# Patient Record
Sex: Female | Born: 1945 | Race: White | Hispanic: No | Marital: Married | Smoking: Never smoker
Health system: Southern US, Community
[De-identification: ages and names within clinical notes are randomized; demographics above are authoritative.]

## PROBLEM LIST (undated history)

## (undated) DIAGNOSIS — I10 Essential (primary) hypertension: Secondary | ICD-10-CM

## (undated) DIAGNOSIS — Z9889 Other specified postprocedural states: Secondary | ICD-10-CM

## (undated) DIAGNOSIS — K219 Gastro-esophageal reflux disease without esophagitis: Secondary | ICD-10-CM

## (undated) DIAGNOSIS — N8110 Cystocele, unspecified: Secondary | ICD-10-CM

## (undated) DIAGNOSIS — N390 Urinary tract infection, site not specified: Secondary | ICD-10-CM

## (undated) DIAGNOSIS — R112 Nausea with vomiting, unspecified: Secondary | ICD-10-CM

## (undated) DIAGNOSIS — M1990 Unspecified osteoarthritis, unspecified site: Secondary | ICD-10-CM

## (undated) DIAGNOSIS — H409 Unspecified glaucoma: Secondary | ICD-10-CM

## (undated) HISTORY — DX: Urinary tract infection, site not specified: N39.0

## (undated) HISTORY — PX: HAND SURGERY: SHX662

## (undated) HISTORY — DX: Cystocele, unspecified: N81.10

---

## 1987-12-31 HISTORY — PX: BREAST CYST ASPIRATION: SHX578

## 1997-01-13 DIAGNOSIS — N390 Urinary tract infection, site not specified: Secondary | ICD-10-CM | POA: Insufficient documentation

## 2005-03-27 ENCOUNTER — Ambulatory Visit: Payer: Self-pay | Admitting: Internal Medicine

## 2005-04-03 ENCOUNTER — Ambulatory Visit: Payer: Self-pay | Admitting: Internal Medicine

## 2006-04-16 ENCOUNTER — Ambulatory Visit: Payer: Self-pay | Admitting: Internal Medicine

## 2006-05-27 ENCOUNTER — Ambulatory Visit: Payer: Self-pay | Admitting: Unknown Physician Specialty

## 2006-05-30 DIAGNOSIS — N8110 Cystocele, unspecified: Secondary | ICD-10-CM | POA: Insufficient documentation

## 2007-04-22 ENCOUNTER — Ambulatory Visit: Payer: Self-pay | Admitting: Internal Medicine

## 2007-11-11 ENCOUNTER — Ambulatory Visit: Payer: Self-pay | Admitting: Internal Medicine

## 2008-05-19 ENCOUNTER — Ambulatory Visit: Payer: Self-pay | Admitting: Internal Medicine

## 2009-05-24 ENCOUNTER — Ambulatory Visit: Payer: Self-pay | Admitting: Internal Medicine

## 2010-06-18 ENCOUNTER — Ambulatory Visit: Payer: Self-pay | Admitting: Internal Medicine

## 2010-09-12 ENCOUNTER — Ambulatory Visit: Payer: Self-pay | Admitting: Unknown Physician Specialty

## 2011-06-20 ENCOUNTER — Ambulatory Visit: Payer: Self-pay | Admitting: Internal Medicine

## 2012-06-22 ENCOUNTER — Ambulatory Visit: Payer: Self-pay | Admitting: Internal Medicine

## 2013-01-12 ENCOUNTER — Ambulatory Visit: Payer: Self-pay | Admitting: Surgery

## 2013-01-12 LAB — CBC
HGB: 13.5 g/dL (ref 12.0–16.0)
MCV: 89 fL (ref 80–100)
RBC: 4.56 10*6/uL (ref 3.80–5.20)

## 2013-01-19 ENCOUNTER — Ambulatory Visit: Payer: Self-pay | Admitting: Surgery

## 2013-07-12 ENCOUNTER — Ambulatory Visit: Payer: Self-pay | Admitting: Internal Medicine

## 2014-02-23 ENCOUNTER — Ambulatory Visit: Payer: Self-pay | Admitting: Unknown Physician Specialty

## 2014-04-20 ENCOUNTER — Emergency Department: Payer: Self-pay | Admitting: Internal Medicine

## 2014-05-10 ENCOUNTER — Encounter: Payer: Self-pay | Admitting: Orthopedic Surgery

## 2014-05-29 DIAGNOSIS — M810 Age-related osteoporosis without current pathological fracture: Secondary | ICD-10-CM | POA: Insufficient documentation

## 2014-05-29 DIAGNOSIS — I10 Essential (primary) hypertension: Secondary | ICD-10-CM | POA: Insufficient documentation

## 2014-05-29 DIAGNOSIS — R87619 Unspecified abnormal cytological findings in specimens from cervix uteri: Secondary | ICD-10-CM | POA: Insufficient documentation

## 2014-05-29 DIAGNOSIS — K219 Gastro-esophageal reflux disease without esophagitis: Secondary | ICD-10-CM | POA: Insufficient documentation

## 2014-05-30 ENCOUNTER — Encounter: Payer: Self-pay | Admitting: Orthopedic Surgery

## 2014-06-29 ENCOUNTER — Encounter: Payer: Self-pay | Admitting: Orthopedic Surgery

## 2014-07-13 ENCOUNTER — Ambulatory Visit: Payer: Self-pay | Admitting: Internal Medicine

## 2014-07-30 ENCOUNTER — Encounter: Payer: Self-pay | Admitting: Orthopedic Surgery

## 2014-08-30 ENCOUNTER — Encounter: Payer: Self-pay | Admitting: Orthopedic Surgery

## 2015-04-21 NOTE — Op Note (Signed)
PATIENT NAME:  Shelley Hall, Shelley Hall MR#:  [id number] DATE OF BIRTH:  05/19/1946  DATE OF PROCEDURE:  01/19/2013  PREOPERATIVE DIAGNOSIS: Right inguinal hernia.   POSTOPERATIVE DIAGNOSIS: Right inguinal hernia.   PROCEDURE: Right inguinal hernia repair.   SURGEON: Renda RollsWilton Smith, M.D.   ANESTHESIA: General.   INDICATIONS: This 69 year old female recently has had bulging in the right groin. A right inguinal hernia was demonstrated on physical exam and repair is recommended for definitive treatment.   DESCRIPTION OF PROCEDURE: The patient was placed on the operating table in the supine position under general anesthesia. The right lower quadrant was clipped and prepared with ChloraPrep, draped in a sterile manner.   A right lower quadrant transversely-oriented suprapubic incision was made and carried down through subcutaneous tissues. One traversing vein was divided between 4-0 Vicryl suture ligatures. Scarpa's fascia was incised. A number of small bleeding points were cauterized. The external ring was identified. The external oblique aponeurosis was incised to open the external ring and expose an indirect hernia sac. There was also a cord lipoma. The sac was dissected free from surrounding structures. The cord lipoma was also dissected. The round ligament was suture ligated with 4-0 Vicryl and divided. The sac was opened. Its continuity with the peritoneal cavity was demonstrated. A high ligation of the sac was done with a 0 Surgilon suture ligature and amputated. The cord lipoma was also ligated with 0 Surgilon suture ligature and amputated. I did not need to send these tissues for pathology.   The repair was carried out with a row of 0 Surgilon sutures, suturing the shelving edge of the inguinal ligament to the conjoined tendon, incorporating transversalis fascia into the repair. This suture line began at the pubic tubercle and was carried out to completely obliterate the internal ring. The deep fascia  superior and lateral to the repair site was infiltrated with 0.5% Sensorcaine with epinephrine. The external oblique aponeurosis were closed with a running 4-0 Vicryl. Additional 0.5% Sensorcaine with epinephrine was infiltrated, also subcutaneous tissues were infiltrated. Scarpa's fascia was closed with interrupted 4-0 Vicryl. The skin was closed with running 4-0 Monocryl subcuticular suture and Dermabond. The patient tolerated surgery satisfactorily and was then prepared for transfer to the recovery room    ____________________________ J. Renda RollsWilton Smith, MD jws:cs D: 01/19/2013 15:01:32 ET T: 01/19/2013 15:22:34 ET JOB#: [id number]  cc: Adella HareJ. Wilton Smith, MD, <Dictator> Adella HareWILTON J SMITH MD ELECTRONICALLY SIGNED 01/20/2013 9:59

## 2015-06-19 DIAGNOSIS — Z0000 Encounter for general adult medical examination without abnormal findings: Secondary | ICD-10-CM | POA: Insufficient documentation

## 2015-06-27 ENCOUNTER — Other Ambulatory Visit: Payer: Self-pay | Admitting: Internal Medicine

## 2015-06-27 DIAGNOSIS — Z139 Encounter for screening, unspecified: Secondary | ICD-10-CM

## 2015-07-18 ENCOUNTER — Encounter (INDEPENDENT_AMBULATORY_CARE_PROVIDER_SITE_OTHER): Payer: Self-pay

## 2015-07-18 ENCOUNTER — Ambulatory Visit
Admission: RE | Admit: 2015-07-18 | Discharge: 2015-07-18 | Disposition: A | Discharge status: Home / Self Care | Payer: Medicare Other | Source: Ambulatory Visit | Attending: Internal Medicine | Admitting: Internal Medicine

## 2015-07-18 ENCOUNTER — Other Ambulatory Visit: Payer: Self-pay | Admitting: Internal Medicine

## 2015-07-18 DIAGNOSIS — Z139 Encounter for screening, unspecified: Secondary | ICD-10-CM

## 2015-07-18 DIAGNOSIS — Z1231 Encounter for screening mammogram for malignant neoplasm of breast: Secondary | ICD-10-CM | POA: Diagnosis present

## 2016-06-19 DIAGNOSIS — N3020 Other chronic cystitis without hematuria: Secondary | ICD-10-CM | POA: Insufficient documentation

## 2016-06-19 DIAGNOSIS — Z87442 Personal history of urinary calculi: Secondary | ICD-10-CM | POA: Insufficient documentation

## 2016-06-19 DIAGNOSIS — R3129 Other microscopic hematuria: Secondary | ICD-10-CM | POA: Insufficient documentation

## 2016-06-19 DIAGNOSIS — R339 Retention of urine, unspecified: Secondary | ICD-10-CM | POA: Insufficient documentation

## 2016-06-24 ENCOUNTER — Other Ambulatory Visit: Payer: Self-pay | Admitting: Internal Medicine

## 2016-06-24 DIAGNOSIS — Z1231 Encounter for screening mammogram for malignant neoplasm of breast: Secondary | ICD-10-CM

## 2016-07-18 ENCOUNTER — Ambulatory Visit: Payer: Medicare Other

## 2016-07-26 ENCOUNTER — Other Ambulatory Visit: Payer: Self-pay | Admitting: Internal Medicine

## 2016-07-26 ENCOUNTER — Ambulatory Visit
Admission: RE | Admit: 2016-07-26 | Discharge: 2016-07-26 | Disposition: A | Discharge status: Home / Self Care | Payer: Medicare Other | Source: Ambulatory Visit | Attending: Internal Medicine | Admitting: Internal Medicine

## 2016-07-26 DIAGNOSIS — Z1231 Encounter for screening mammogram for malignant neoplasm of breast: Secondary | ICD-10-CM | POA: Insufficient documentation

## 2017-06-30 ENCOUNTER — Other Ambulatory Visit: Payer: Self-pay | Admitting: Internal Medicine

## 2017-06-30 DIAGNOSIS — Z1231 Encounter for screening mammogram for malignant neoplasm of breast: Secondary | ICD-10-CM

## 2017-08-05 ENCOUNTER — Ambulatory Visit
Admission: RE | Admit: 2017-08-05 | Discharge: 2017-08-05 | Disposition: A | Discharge status: Home / Self Care | Payer: Medicare Other | Source: Ambulatory Visit | Attending: Internal Medicine | Admitting: Internal Medicine

## 2017-08-05 DIAGNOSIS — Z1231 Encounter for screening mammogram for malignant neoplasm of breast: Secondary | ICD-10-CM | POA: Diagnosis present

## 2017-10-01 DIAGNOSIS — R300 Dysuria: Secondary | ICD-10-CM | POA: Insufficient documentation

## 2018-06-30 ENCOUNTER — Other Ambulatory Visit: Payer: Self-pay | Admitting: Internal Medicine

## 2018-07-06 ENCOUNTER — Other Ambulatory Visit: Payer: Self-pay | Admitting: Internal Medicine

## 2018-07-06 DIAGNOSIS — Z1231 Encounter for screening mammogram for malignant neoplasm of breast: Secondary | ICD-10-CM

## 2018-08-07 ENCOUNTER — Ambulatory Visit
Admission: RE | Admit: 2018-08-07 | Discharge: 2018-08-07 | Disposition: A | Discharge status: Home / Self Care | Payer: Medicare Other | Source: Ambulatory Visit | Attending: Internal Medicine | Admitting: Internal Medicine

## 2018-08-07 DIAGNOSIS — Z1231 Encounter for screening mammogram for malignant neoplasm of breast: Secondary | ICD-10-CM | POA: Diagnosis not present

## 2019-06-21 ENCOUNTER — Other Ambulatory Visit: Payer: Self-pay | Admitting: Internal Medicine

## 2019-06-21 DIAGNOSIS — Z1231 Encounter for screening mammogram for malignant neoplasm of breast: Secondary | ICD-10-CM

## 2019-07-13 ENCOUNTER — Ambulatory Visit (INDEPENDENT_AMBULATORY_CARE_PROVIDER_SITE_OTHER): Payer: Medicare Other | Admitting: Urology

## 2019-07-13 ENCOUNTER — Other Ambulatory Visit: Payer: Self-pay

## 2019-07-13 ENCOUNTER — Encounter: Payer: Self-pay | Admitting: Urology

## 2019-07-13 VITALS — BP 126/74 | HR 71 | Ht <= 58 in | Wt 125.5 lb

## 2019-07-13 DIAGNOSIS — N390 Urinary tract infection, site not specified: Secondary | ICD-10-CM

## 2019-07-13 DIAGNOSIS — N8110 Cystocele, unspecified: Secondary | ICD-10-CM

## 2019-07-13 DIAGNOSIS — S62309A Unspecified fracture of unspecified metacarpal bone, initial encounter for closed fracture: Secondary | ICD-10-CM | POA: Insufficient documentation

## 2019-07-13 DIAGNOSIS — S62613A Displaced fracture of proximal phalanx of left middle finger, initial encounter for closed fracture: Secondary | ICD-10-CM | POA: Insufficient documentation

## 2019-07-13 DIAGNOSIS — M25549 Pain in joints of unspecified hand: Secondary | ICD-10-CM | POA: Insufficient documentation

## 2019-07-13 DIAGNOSIS — M25649 Stiffness of unspecified hand, not elsewhere classified: Secondary | ICD-10-CM | POA: Insufficient documentation

## 2019-07-13 LAB — BLADDER SCAN AMB NON-IMAGING

## 2019-07-13 NOTE — Progress Notes (Signed)
07/13/2019 1:27 PM   Shelley Hall August 10, 1946 [id number]  Referring provider: Lauro RegulusAnderson, Marshall W, MD [address] [facility] [place],  [place] [id number]  Chief Complaint  Patient presents with  . Urinary Tract Infection    HPI: Shelley Hall is a 73 year old female who presents to establish local urologic care.  She [facility] and last saw him in April 2019.  She has been on cranberry and Estrace cream although the prescription was written for a daily dose.  She was treated for E. coli UTIs in March and 2 times in April 2020.  A follow-up urine culture in May was negative.  She has urinary frequency which she attributes to increased hydration.  She has mild urinary urgency.  She does relate to a cystocele and is wondering if this could contribute to her urinary tract infections.  She does note protrusion of tissue through her vaginal introitus.  [facility]  A CT urogram in 2017 showed no upper tract abnormalities.   PMH: Past Medical History:  Diagnosis Date  . Female bladder prolapse   . Recurrent UTI     Surgical History: Past Surgical History:  Procedure Laterality Date  . BREAST CYST ASPIRATION Right 1989    Home Medications:  Allergies as of 07/13/2019      Reactions   Codeine Nausea Only   Sulfamethoxazole-trimethoprim Nausea Only   Doxycycline    Other reaction(s): Headache Headaches Pt has had same headaches off medication Headaches Pt has had same headaches off medication   Sulfa Antibiotics Nausea Only      Medication List       Accurate as of July 13, 2019  1:27 PM. If you have any questions, ask your nurse or doctor.        aspirin EC 81 MG tablet Take by mouth.   [employer] Probiotic Maximum Strength Caps   estradiol 0.1 MG/GM vaginal cream Commonly known as: ESTRACE Place vaginally.   losartan-hydrochlorothiazide 50-12.5 MG tablet Commonly known as: HYZAAR    omeprazole 20 MG capsule Commonly known as: PRILOSEC   potassium chloride 10 MEQ tablet Commonly known as: K-DUR   PreserVision AREDS 2 Caps   valsartan-hydrochlorothiazide 160-12.5 MG tablet Commonly known as: DIOVAN-HCT       Allergies:  Allergies  Allergen Reactions  . Codeine Nausea Only  . Sulfamethoxazole-Trimethoprim Nausea Only  . Doxycycline     Other reaction(s): Headache Headaches Pt has had same headaches off medication Headaches Pt has had same headaches off medication   . Sulfa Antibiotics Nausea Only    Family History: Family History  Problem Relation Age of Onset  . Breast cancer Neg Hx     Social History:  reports that she has never smoked. She has never used smokeless tobacco. She reports previous alcohol use. She reports that she does not use drugs.  ROS: UROLOGY Frequent Urination?: Yes Hard to postpone urination?: No Burning/pain with urination?: No Get up at night to urinate?: Yes Leakage of urine?: No Urine stream starts and stops?: No Trouble starting stream?: No Do you have to strain to urinate?: No Blood in urine?: No Urinary tract infection?: Yes Sexually transmitted disease?: No Injury to kidneys or bladder?: No Painful intercourse?: No Weak stream?: No Currently pregnant?: No Vaginal bleeding?: No Last menstrual period?: n  Gastrointestinal Nausea?: No Vomiting?: No Indigestion/heartburn?: No Diarrhea?: No Constipation?: No  Constitutional Fever: No Night sweats?: No  Weight loss?: No Fatigue?: No  Skin Skin rash/lesions?: No Itching?: No  Eyes Blurred vision?: No Double vision?: No  Ears/Nose/Throat Sore throat?: No Sinus problems?: No  Hematologic/Lymphatic Swollen glands?: No Easy bruising?: No  Cardiovascular Leg swelling?: No Chest pain?: No  Respiratory Cough?: No Shortness of breath?: No  Endocrine Excessive thirst?: No  Musculoskeletal Back pain?: No Joint pain?: No  Neurological  Headaches?: No Dizziness?: No  Psychologic Depression?: No Anxiety?: No  Physical Exam: BP 126/74 (BP Location: Left Arm, Patient Position: Sitting, Cuff Size: Normal)   Pulse 71   Ht 4\' 10"  (1.473 m)   Wt 125 lb 8 oz (56.9 kg)   BMI 26.23 kg/m   Constitutional:  Alert and oriented, No acute distress. HEENT: [place] AT, moist mucus membranes.  Trachea midline, no masses. Cardiovascular: No clubbing, cyanosis, or edema. Respiratory: Normal respiratory effort, no increased work of breathing. GI: Abdomen is soft, nontender, nondistended, no abdominal masses GU: No CVA tenderness Lymph: No cervical or inguinal lymphadenopathy. Skin: No rashes, bruises or suspicious lesions. Neurologic: Grossly intact, no focal deficits, moving all 4 extremities. Psychiatric: Normal mood and affect.  Laboratory Data:  Urinalysis Trace leukocytes; microscopy negative   Assessment & Plan:   73 year old female with recurrent lower tract UTIs.  Urinalysis today was clear.  PVR by bladder scan was 51mL.  She is emptying adequately so her cystocele would not be a significant factor to her recurrent UTIs.  I offered her an appointment to gynecology regarding her cystocele however she wanted to hold off.  Recommend decreasing her Estrace cream to twice weekly.  She will continue cranberry and also wanted to start d-mannose.  Return in about 1 year (around 07/12/2020) for Recheck.   Abbie Sons, [place] [address] [place], [place] [address] [phone]

## 2019-07-14 LAB — MICROSCOPIC EXAMINATION
Bacteria, UA: NONE SEEN
RBC: NONE SEEN /hpf (ref 0–2)

## 2019-07-14 LAB — URINALYSIS, COMPLETE
Bilirubin, UA: NEGATIVE
Glucose, UA: NEGATIVE
Ketones, UA: NEGATIVE
Nitrite, UA: NEGATIVE
Protein,UA: NEGATIVE
RBC, UA: NEGATIVE
Specific Gravity, UA: 1.025 (ref 1.005–1.030)
Urobilinogen, Ur: 0.2 mg/dL (ref 0.2–1.0)
pH, UA: 6 (ref 5.0–7.5)

## 2019-08-16 ENCOUNTER — Ambulatory Visit
Admission: RE | Admit: 2019-08-16 | Discharge: 2019-08-16 | Disposition: A | Discharge status: Home / Self Care | Payer: Medicare Other | Source: Ambulatory Visit | Attending: Internal Medicine | Admitting: Internal Medicine

## 2019-08-16 ENCOUNTER — Other Ambulatory Visit: Payer: Self-pay

## 2019-08-16 DIAGNOSIS — Z1231 Encounter for screening mammogram for malignant neoplasm of breast: Secondary | ICD-10-CM | POA: Insufficient documentation

## 2020-07-10 ENCOUNTER — Ambulatory Visit: Payer: Medicare Other | Admitting: Urology

## 2020-07-13 NOTE — Progress Notes (Signed)
07/14/2020 1:32 PM   Norvel Richards 1946-04-10 [id number]  Referring provider: Lauro Regulus, MD [address] [facility] [place],  [place] [address] Chief Complaint  Patient presents with  . Other    HPI: Shelley Hall is a 74 y.o. female with recurrent lower tract UTIs who is seen today for a 1 year follow up.   -CT urogram in 2017 showed no upper tract abnormalities. -She has previously been followed by Dr. [facility]illes [facility]unk at [facility] and last saw him in April 2019. Prior PVRs at [facility] were minimal. 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  Notes mild increased frequency but no dysuria -Does feel starting D-mannose has significantly decreased the frequency of her infections  PMH: Past Medical History:  Diagnosis Date  . Female bladder prolapse   . Recurrent UTI     Surgical History: Past Surgical History:  Procedure Laterality Date  . BREAST CYST ASPIRATION Right 1989    Home Medications:  Allergies as of 07/14/2020      Reactions   Codeine Nausea Only   Sulfamethoxazole-trimethoprim Nausea Only   Doxycycline    Other reaction(s): Headache Headaches Pt has had same headaches off medication Headaches Pt has had same headaches off medication   Sulfa Antibiotics Nausea Only      Medication List       Accurate as of July 14, 2020  1:32 PM. If you have any questions, ask your nurse or doctor.        aspirin EC 81 MG tablet Take by mouth.   [employer]S Probiotic Maximum Strength Caps   estradiol 0.1 MG/GM vaginal cream Commonly known as: ESTRACE Pleace pea size amount to urethra twice a week.   furosemide 20 MG tablet Commonly known as: LASIX Take by  mouth.   losartan-hydrochlorothiazide 50-12.5 MG tablet Commonly known as: HYZAAR   omeprazole 20 MG capsule Commonly known as: PRILOSEC   potassium chloride 10 MEQ tablet Commonly known as: KLOR-CON   PreserVision AREDS 2 Caps   topiramate 25 MG tablet Commonly known as: TOPAMAX Take by mouth.   valsartan-hydrochlorothiazide 160-12.5 MG tablet Commonly known as: DIOVAN-HCT       Allergies:  Allergies  Allergen Reactions  . Codeine Nausea Only  . Sulfamethoxazole-Trimethoprim Nausea Only  . Doxycycline     Other reaction(s): Headache Headaches Pt has had same headaches off medication Headaches Pt has had same headaches off medication   . Sulfa Antibiotics Nausea Only    Family History: Family History  Problem Relation Age of Onset  . Breast cancer Neg Hx     Social History:  reports that she has never smoked. She has never used smokeless tobacco. She reports previous alcohol use. She reports that she does not use drugs.   Physical Exam: BP (!) 161/95   Pulse 86   Ht 4\' 10"  (1.473 m)   Wt 126 lb (57.2 kg)   BMI 26.33 kg/m   Constitutional:  Alert and oriented, No acute distress. HEENT: [place] AT, moist mucus membranes.  Trachea midline, no masses. Cardiovascular: No clubbing, cyanosis, or edema. Respiratory: Normal respiratory effort, no increased work of breathing. Skin: No rashes, bruises or suspicious lesions. Neurologic: Grossly intact, no focal  deficits, moving all 4 extremities. Psychiatric: Normal mood and affect.   Urinalysis Dipstick/microscopy negative  Assessment & Plan:    1. Recurrent lower tract UTIs Urinalysis today unremarkable Continue estrace cream 2 times/week, D-mannose and cranberry supplementation.  PVR by bladder scan was 17 mL  Follow up in 1 year or sooner if she experiences symptoms   [facility] [address] [place], [place] [address] [phone]  I, Theador Hawthorne, am acting  as a scribe for Dr. Lorin Picket C. Amberleigh Gerken,  I have reviewed the above documentation for accuracy and completeness, and I agree with the above.   Riki Altes, MD

## 2020-07-14 ENCOUNTER — Encounter: Payer: Self-pay | Admitting: Urology

## 2020-07-14 ENCOUNTER — Other Ambulatory Visit: Payer: Self-pay

## 2020-07-14 ENCOUNTER — Ambulatory Visit: Payer: Medicare Other | Admitting: Urology

## 2020-07-14 VITALS — BP 161/95 | HR 86 | Ht <= 58 in | Wt 126.0 lb

## 2020-07-14 DIAGNOSIS — N390 Urinary tract infection, site not specified: Secondary | ICD-10-CM | POA: Diagnosis not present

## 2020-07-14 LAB — MICROSCOPIC EXAMINATION: Bacteria, UA: NONE SEEN

## 2020-07-14 LAB — URINALYSIS, COMPLETE
Bilirubin, UA: NEGATIVE
Glucose, UA: NEGATIVE
Ketones, UA: NEGATIVE
Leukocytes,UA: NEGATIVE
Nitrite, UA: NEGATIVE
Protein,UA: NEGATIVE
RBC, UA: NEGATIVE
Specific Gravity, UA: 1.025 (ref 1.005–1.030)
Urobilinogen, Ur: 0.2 mg/dL (ref 0.2–1.0)
pH, UA: 7 (ref 5.0–7.5)

## 2020-07-14 LAB — BLADDER SCAN AMB NON-IMAGING

## 2020-07-20 ENCOUNTER — Other Ambulatory Visit: Payer: Self-pay | Admitting: Internal Medicine

## 2020-07-20 DIAGNOSIS — Z1231 Encounter for screening mammogram for malignant neoplasm of breast: Secondary | ICD-10-CM

## 2020-08-16 ENCOUNTER — Other Ambulatory Visit: Payer: Self-pay

## 2020-08-16 ENCOUNTER — Ambulatory Visit
Admission: RE | Admit: 2020-08-16 | Discharge: 2020-08-16 | Disposition: A | Discharge status: Home / Self Care | Payer: Medicare Other | Source: Ambulatory Visit | Attending: Internal Medicine | Admitting: Internal Medicine

## 2020-08-16 DIAGNOSIS — Z1231 Encounter for screening mammogram for malignant neoplasm of breast: Secondary | ICD-10-CM | POA: Diagnosis not present

## 2020-08-31 ENCOUNTER — Ambulatory Visit
Admission: RE | Admit: 2020-08-31 | Discharge: 2020-08-31 | Disposition: A | Discharge status: Home / Self Care | Payer: Medicare Other | Source: Ambulatory Visit | Attending: Internal Medicine | Admitting: Internal Medicine

## 2020-08-31 ENCOUNTER — Other Ambulatory Visit (HOSPITAL_COMMUNITY): Payer: Self-pay | Admitting: Internal Medicine

## 2020-08-31 ENCOUNTER — Other Ambulatory Visit: Payer: Self-pay | Admitting: Internal Medicine

## 2020-08-31 ENCOUNTER — Other Ambulatory Visit: Payer: Self-pay

## 2020-08-31 DIAGNOSIS — M7989 Other specified soft tissue disorders: Secondary | ICD-10-CM | POA: Insufficient documentation

## 2020-10-27 ENCOUNTER — Other Ambulatory Visit (HOSPITAL_COMMUNITY): Payer: Self-pay | Admitting: Sports Medicine

## 2020-10-27 ENCOUNTER — Other Ambulatory Visit: Payer: Self-pay | Admitting: Sports Medicine

## 2020-10-27 DIAGNOSIS — M79604 Pain in right leg: Secondary | ICD-10-CM

## 2020-10-27 DIAGNOSIS — M7061 Trochanteric bursitis, right hip: Secondary | ICD-10-CM

## 2020-10-27 DIAGNOSIS — M7631 Iliotibial band syndrome, right leg: Secondary | ICD-10-CM

## 2020-11-14 ENCOUNTER — Other Ambulatory Visit: Payer: Self-pay

## 2020-11-14 ENCOUNTER — Ambulatory Visit
Admission: RE | Admit: 2020-11-14 | Discharge: 2020-11-14 | Disposition: A | Discharge status: Home / Self Care | Payer: Medicare Other | Source: Ambulatory Visit | Attending: Sports Medicine | Admitting: Sports Medicine

## 2020-11-14 DIAGNOSIS — M7061 Trochanteric bursitis, right hip: Secondary | ICD-10-CM | POA: Insufficient documentation

## 2020-11-14 DIAGNOSIS — M79604 Pain in right leg: Secondary | ICD-10-CM | POA: Insufficient documentation

## 2020-11-14 DIAGNOSIS — M7631 Iliotibial band syndrome, right leg: Secondary | ICD-10-CM | POA: Diagnosis present

## 2021-01-09 ENCOUNTER — Other Ambulatory Visit (INDEPENDENT_AMBULATORY_CARE_PROVIDER_SITE_OTHER): Payer: Self-pay | Admitting: Nurse Practitioner

## 2021-01-09 DIAGNOSIS — I83893 Varicose veins of bilateral lower extremities with other complications: Secondary | ICD-10-CM

## 2021-01-10 ENCOUNTER — Encounter (INDEPENDENT_AMBULATORY_CARE_PROVIDER_SITE_OTHER): Payer: Self-pay

## 2021-01-10 ENCOUNTER — Encounter (INDEPENDENT_AMBULATORY_CARE_PROVIDER_SITE_OTHER): Payer: Self-pay | Admitting: Nurse Practitioner

## 2021-07-16 ENCOUNTER — Ambulatory Visit: Payer: Self-pay | Admitting: Urology

## 2021-07-18 ENCOUNTER — Ambulatory Visit: Payer: Self-pay | Admitting: Urology

## 2021-07-26 ENCOUNTER — Other Ambulatory Visit: Payer: Self-pay | Admitting: Internal Medicine

## 2021-07-26 DIAGNOSIS — Z1231 Encounter for screening mammogram for malignant neoplasm of breast: Secondary | ICD-10-CM

## 2021-08-20 ENCOUNTER — Ambulatory Visit
Admission: RE | Admit: 2021-08-20 | Discharge: 2021-08-20 | Disposition: A | Discharge status: Home / Self Care | Payer: Medicare Other | Source: Ambulatory Visit | Attending: Internal Medicine | Admitting: Internal Medicine

## 2021-08-20 ENCOUNTER — Other Ambulatory Visit: Payer: Self-pay

## 2021-08-20 DIAGNOSIS — Z1231 Encounter for screening mammogram for malignant neoplasm of breast: Secondary | ICD-10-CM | POA: Diagnosis not present

## 2021-08-31 IMAGING — MG MM DIGITAL SCREENING BILAT W/ TOMO AND CAD
8 series · 8 of 24 positions shown · non-contrast
Comparison: Previous exam(s).

CLINICAL DATA: Screening.

EXAM:
DIGITAL SCREENING BILATERAL MAMMOGRAM WITH TOMOSYNTHESIS AND CAD
TECHNIQUE: Bilateral screening digital craniocaudal and mediolateral oblique
mammograms were obtained. Bilateral screening digital breast
tomosynthesis was performed. The images were evaluated with
computer-aided detection.

[R MLO synth-2D]
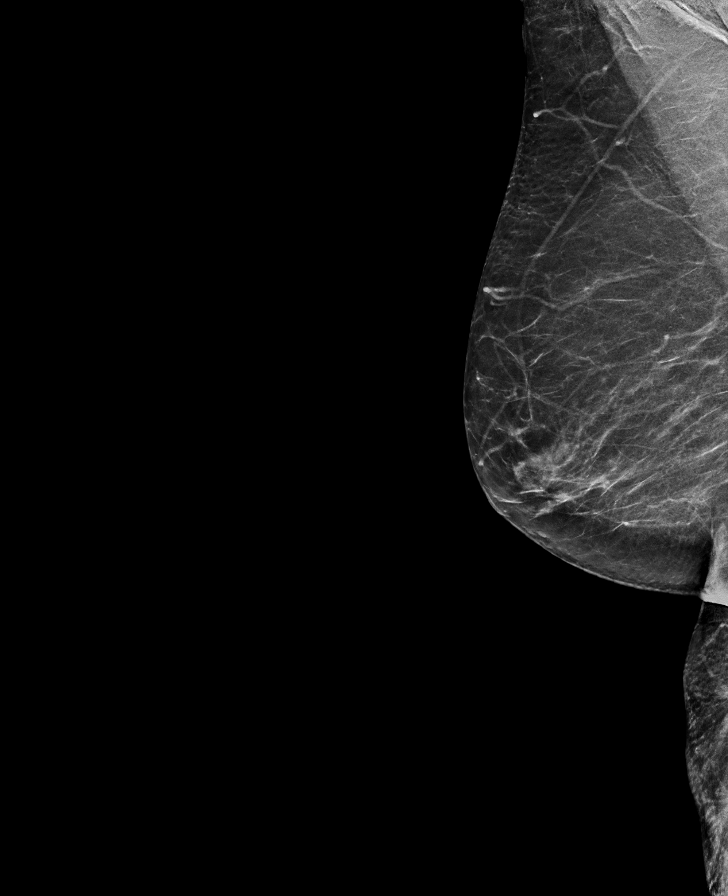

[R CC synth-2D]
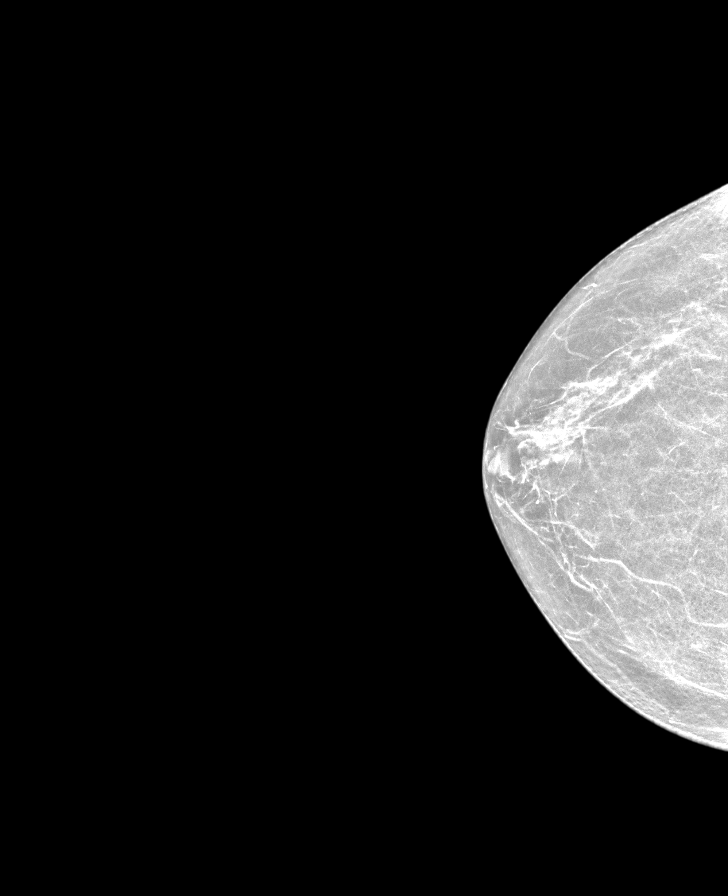

[L MLO synth-2D]
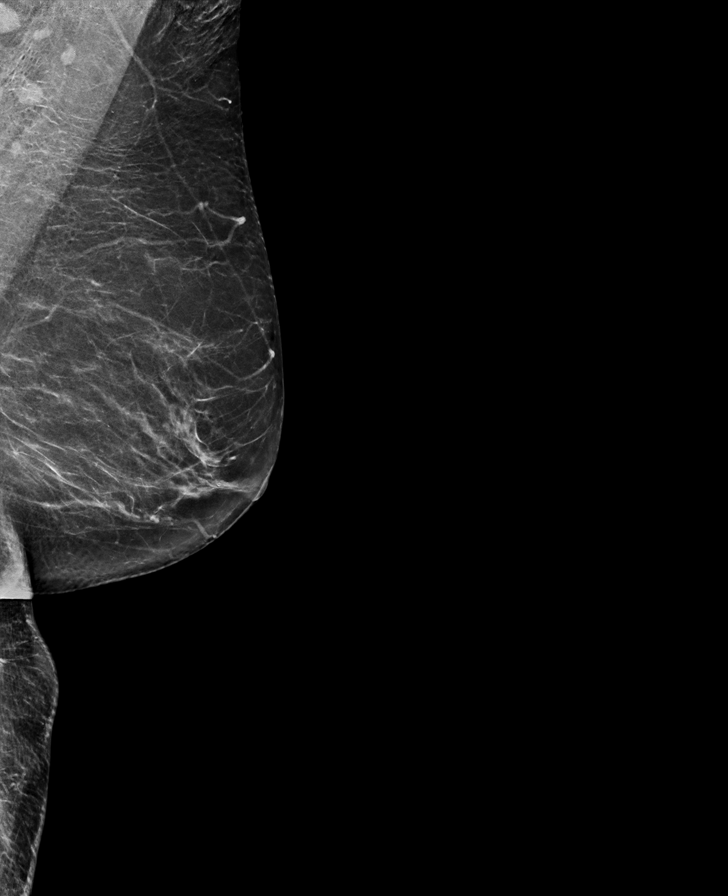

[L CC synth-2D]
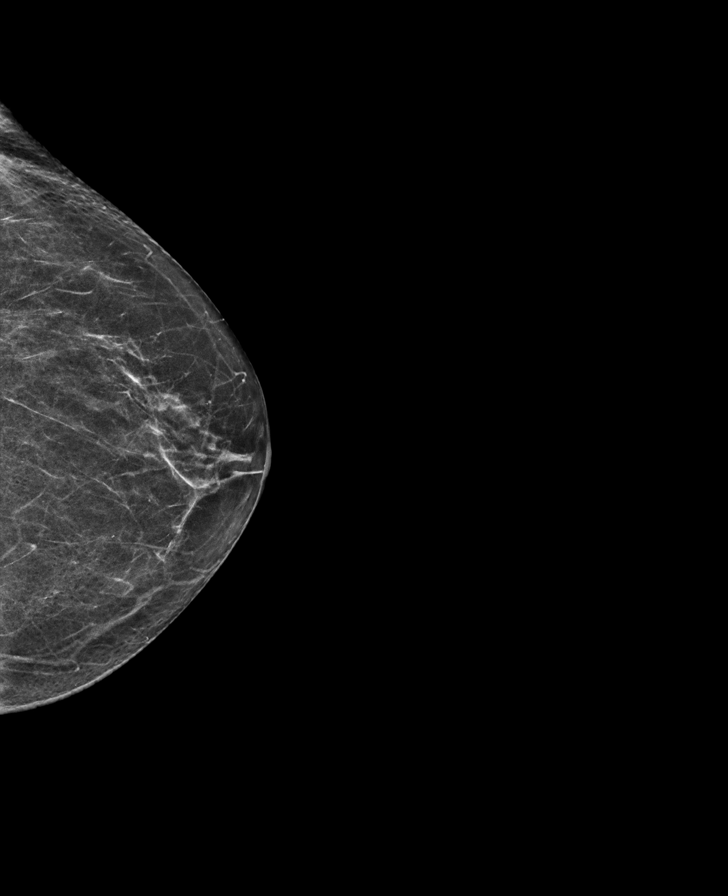

[R MLO tomo · tomo slice 31/62.0]
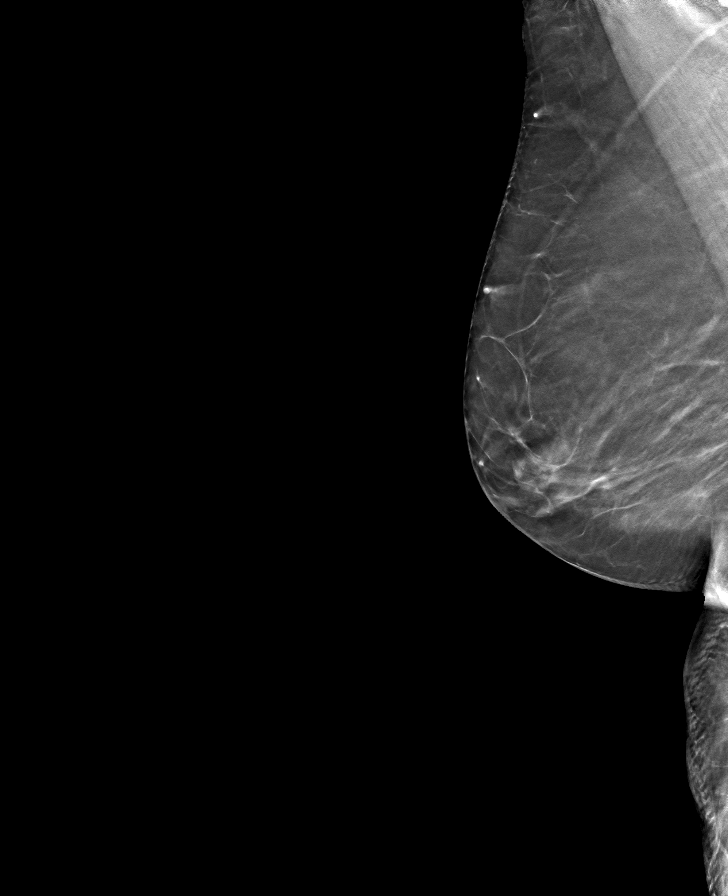

[L CC tomo · tomo slice 27/54.0]
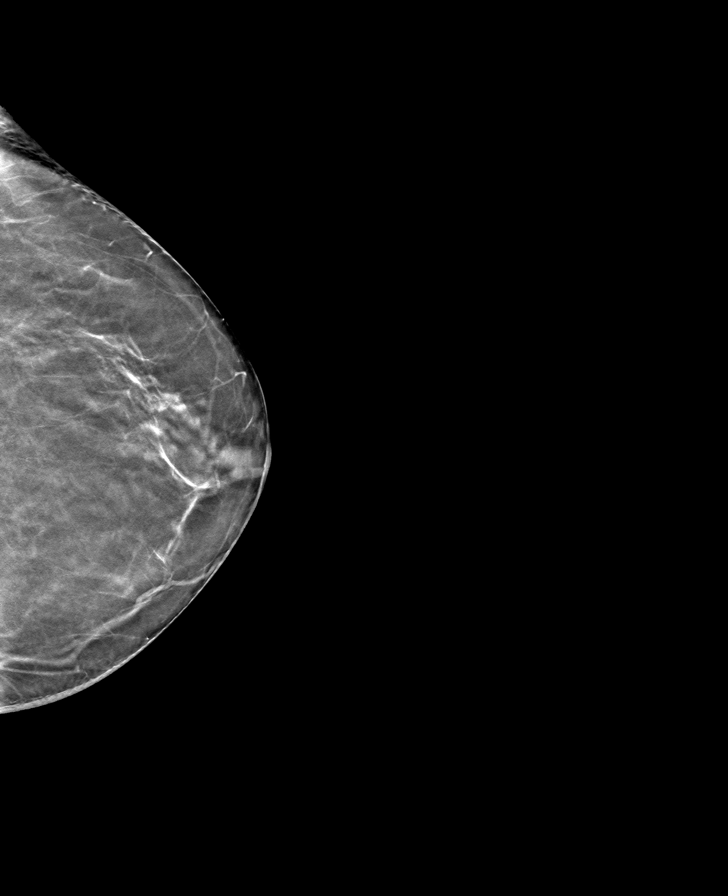

[R CC tomo · tomo slice 27/53.0]
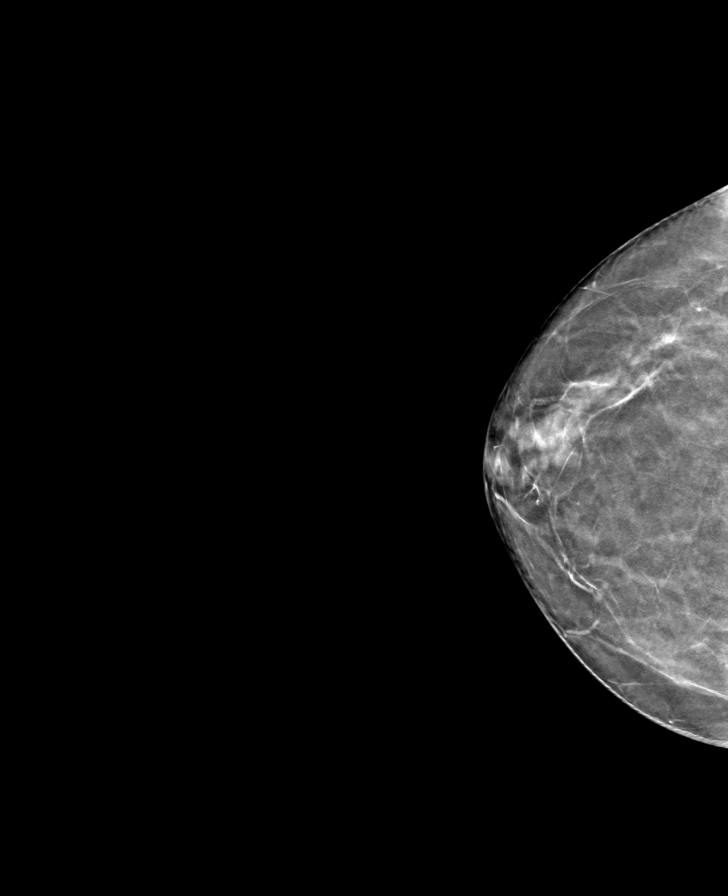

[L MLO tomo · tomo slice 32/63.0]
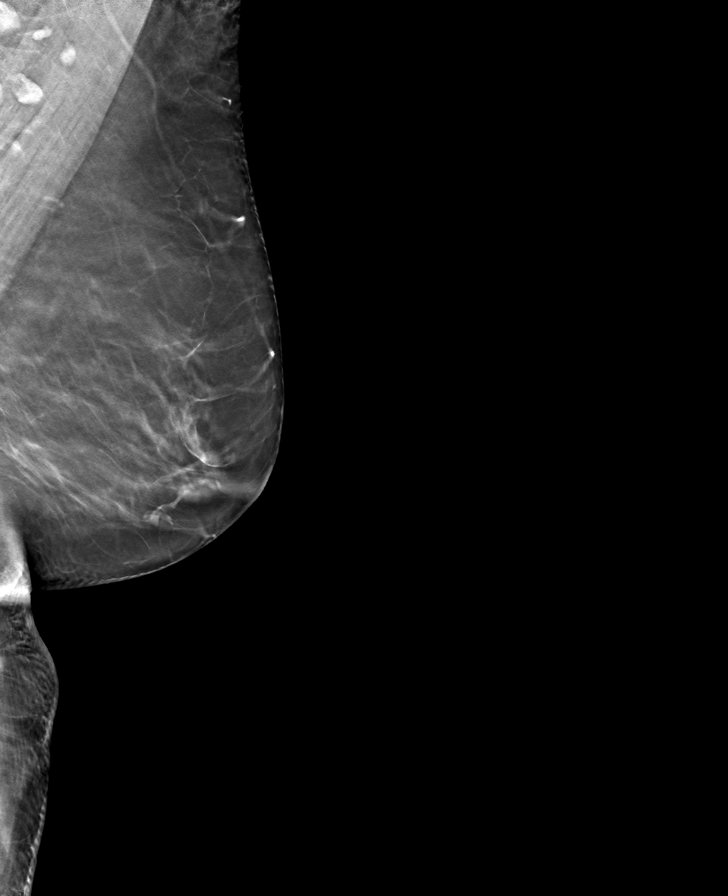

[8 of 24 positions shown; findings below may reference images not displayed]

ACR Breast Density Category b: There are scattered areas of
fibroglandular density.
FINDINGS: There are no findings suspicious for malignancy.
IMPRESSION: No mammographic evidence of malignancy. A result letter of this
screening mammogram will be mailed directly to the patient.

RECOMMENDATION:
Screening mammogram in one year. (Code:51-O-LD2)

BI-RADS CATEGORY  1: Negative.

## 2021-10-08 ENCOUNTER — Encounter: Payer: Self-pay | Admitting: Ophthalmology

## 2021-10-22 NOTE — Discharge Instructions (Signed)

## 2021-10-23 ENCOUNTER — Encounter
Admission: RE | Disposition: A | Discharge status: Home / Self Care | Payer: Self-pay | Source: Ambulatory Visit | Attending: Ophthalmology

## 2021-10-23 ENCOUNTER — Other Ambulatory Visit: Payer: Self-pay

## 2021-10-23 ENCOUNTER — Ambulatory Visit
Admission: RE | Admit: 2021-10-23 | Discharge: 2021-10-23 | Disposition: A | Discharge status: Home / Self Care | Payer: Medicare Other | Source: Ambulatory Visit | Attending: Ophthalmology | Admitting: Ophthalmology

## 2021-10-23 ENCOUNTER — Ambulatory Visit: Payer: Medicare Other | Admitting: Anesthesiology

## 2021-10-23 ENCOUNTER — Encounter: Payer: Self-pay | Admitting: Ophthalmology

## 2021-10-23 DIAGNOSIS — H409 Unspecified glaucoma: Secondary | ICD-10-CM | POA: Diagnosis not present

## 2021-10-23 DIAGNOSIS — H2512 Age-related nuclear cataract, left eye: Secondary | ICD-10-CM | POA: Diagnosis present

## 2021-10-23 DIAGNOSIS — Z885 Allergy status to narcotic agent status: Secondary | ICD-10-CM | POA: Diagnosis not present

## 2021-10-23 DIAGNOSIS — Z882 Allergy status to sulfonamides status: Secondary | ICD-10-CM | POA: Insufficient documentation

## 2021-10-23 DIAGNOSIS — Z881 Allergy status to other antibiotic agents status: Secondary | ICD-10-CM | POA: Diagnosis not present

## 2021-10-23 DIAGNOSIS — Z79899 Other long term (current) drug therapy: Secondary | ICD-10-CM | POA: Diagnosis not present

## 2021-10-23 DIAGNOSIS — Z7982 Long term (current) use of aspirin: Secondary | ICD-10-CM | POA: Diagnosis not present

## 2021-10-23 HISTORY — DX: Unspecified glaucoma: H40.9

## 2021-10-23 HISTORY — PX: CATARACT EXTRACTION W/PHACO: SHX586

## 2021-10-23 HISTORY — DX: Nausea with vomiting, unspecified: R11.2

## 2021-10-23 HISTORY — DX: Unspecified osteoarthritis, unspecified site: M19.90

## 2021-10-23 HISTORY — DX: Gastro-esophageal reflux disease without esophagitis: K21.9

## 2021-10-23 HISTORY — DX: Other specified postprocedural states: Z98.890

## 2021-10-23 HISTORY — DX: Essential (primary) hypertension: I10

## 2021-10-23 SURGERY — PHACOEMULSIFICATION, CATARACT, WITH IOL INSERTION
Anesthesia: Monitor Anesthesia Care | Site: Eye | Laterality: Left

## 2021-10-23 MED ORDER — MIDAZOLAM HCL 2 MG/2ML IJ SOLN
INTRAMUSCULAR | Status: DC | PRN
  Administered 2021-10-23: 1 mg via INTRAVENOUS

## 2021-10-23 MED ORDER — TETRACAINE HCL 0.5 % OP SOLN
1.0000 [drp] | OPHTHALMIC | Status: DC | PRN
  Administered 2021-10-23 (×3): 1 [drp] via OPHTHALMIC

## 2021-10-23 MED ORDER — MOXIFLOXACIN HCL 0.5 % OP SOLN
OPHTHALMIC | Status: DC | PRN
  Administered 2021-10-23: 0.2 mL via OPHTHALMIC

## 2021-10-23 MED ORDER — SIGHTPATH DOSE#1 NA CHONDROIT SULF-NA HYALURON 40-17 MG/ML IO SOLN
INTRAOCULAR | Status: DC | PRN
  Administered 2021-10-23: 1 mL via INTRAOCULAR

## 2021-10-23 MED ORDER — LACTATED RINGERS IV SOLN: INTRAVENOUS | Status: DC

## 2021-10-23 MED ORDER — BRIMONIDINE TARTRATE-TIMOLOL 0.2-0.5 % OP SOLN
OPHTHALMIC | Status: DC | PRN
  Administered 2021-10-23: 1 [drp] via OPHTHALMIC

## 2021-10-23 MED ORDER — SIGHTPATH DOSE#1 BSS IO SOLN
INTRAOCULAR | Status: DC | PRN
  Administered 2021-10-23: 15 mL

## 2021-10-23 MED ORDER — SIGHTPATH DOSE#1 BSS IO SOLN
INTRAOCULAR | Status: DC | PRN
  Administered 2021-10-23: 1 mL via INTRAMUSCULAR

## 2021-10-23 MED ORDER — ARMC OPHTHALMIC DILATING DROPS
1.0000 "application " | OPHTHALMIC | Status: DC | PRN
  Administered 2021-10-23 (×3): 1 via OPHTHALMIC

## 2021-10-23 MED ORDER — SIGHTPATH DOSE#1 BSS IO SOLN
INTRAOCULAR | Status: DC | PRN
  Administered 2021-10-23: 58 mL via OPHTHALMIC

## 2021-10-23 MED ORDER — FENTANYL CITRATE (PF) 100 MCG/2ML IJ SOLN
INTRAMUSCULAR | Status: DC | PRN
  Administered 2021-10-23: 50 ug via INTRAVENOUS

## 2021-10-23 SURGICAL SUPPLY — 14 items
CANNULA ANT/CHMB 27GA (MISCELLANEOUS) ×2 IMPLANT
GLOVE SURG ENC TEXT LTX SZ8 (GLOVE) ×2 IMPLANT
GLOVE SURG TRIUMPH 8.0 PF LTX (GLOVE) ×2 IMPLANT
GOWN STRL REUS W/ TWL LRG LVL3 (GOWN DISPOSABLE) ×2 IMPLANT
GOWN STRL REUS W/TWL LRG LVL3 (GOWN DISPOSABLE) ×4
LENS IOL IQ PAN TRC 40 21.0 ×1 IMPLANT
LENS IOL PANOP TORIC 40 21.0 ×1 IMPLANT
LENS IOL PANOPTIX TORIC 21.0 ×2 IMPLANT
MARKER SKIN DUAL TIP RULER LAB (MISCELLANEOUS) ×2 IMPLANT
NEEDLE FILTER BLUNT 18X 1/2SAF (NEEDLE) ×1
NEEDLE FILTER BLUNT 18X1 1/2 (NEEDLE) ×1 IMPLANT
SYR 3ML LL SCALE MARK (SYRINGE) ×2 IMPLANT
SYR TB 1ML LUER SLIP (SYRINGE) ×2 IMPLANT
WATER STERILE IRR 250ML POUR (IV SOLUTION) ×2 IMPLANT

## 2021-10-23 NOTE — Anesthesia Preprocedure Evaluation (Signed)
Anesthesia Evaluation  Patient identified by MRN, date of birth, ID band Patient awake    Reviewed: NPO status   History of Anesthesia Complications (+) PONV and history of anesthetic complications  Airway Mallampati: II  TM Distance: >3 FB Neck ROM: full    Dental no notable dental hx.    Pulmonary neg pulmonary ROS,    Pulmonary exam normal        Cardiovascular Exercise Tolerance: Good hypertension, Normal cardiovascular exam     Neuro/Psych  Headaches, glaucoma negative psych ROS   GI/Hepatic Neg liver ROS, GERD  Controlled,  Endo/Other  negative endocrine ROS  Renal/GU negative Renal ROS  negative genitourinary   Musculoskeletal  (+) Arthritis ,   Abdominal   Peds  Hematology negative hematology ROS (+)   Anesthesia Other Findings   Reproductive/Obstetrics                             Anesthesia Physical Anesthesia Plan  ASA: 2  Anesthesia Plan: MAC   Post-op Pain Management:    Induction:   PONV Risk Score and Plan: 3 and Midazolam, TIVA and Ondansetron  Airway Management Planned:   Additional Equipment:   Intra-op Plan:   Post-operative Plan:   Informed Consent: I have reviewed the patients History and Physical, chart, labs and discussed the procedure including the risks, benefits and alternatives for the proposed anesthesia with the patient or authorized representative who has indicated his/her understanding and acceptance.       Plan Discussed with: CRNA  Anesthesia Plan Comments:         Anesthesia Quick Evaluation

## 2021-10-23 NOTE — H&P (Signed)
[facility]   Primary Care Physician:  Lauro Regulus, MD Ophthalmologist: Dr. Druscilla Brownie  Pre-Procedure History & Physical: HPI:  Shelley Hall is a 75 y.o. female here for cataract surgery.   Past Medical History:  Diagnosis Date   Arthritis    Female bladder prolapse    GERD (gastroesophageal reflux disease)    Glaucoma    Hypertension    PONV (postoperative nausea and vomiting)    after hand surgery 2015   Recurrent UTI     Past Surgical History:  Procedure Laterality Date   BREAST CYST ASPIRATION Right 1989   HAND SURGERY      Prior to Admission medications   Medication Sig Start Date End Date Taking? Authorizing Provider  aspirin EC 81 MG tablet Take by mouth. 01/08/99  Yes [provider]  AZO-CRANBERRY PO Take by mouth in the morning and at bedtime.   Yes [provider]  BIOTIN PO Take by mouth daily.   Yes [provider]  brimonidine-timolol (COMBIGAN) 0.2-0.5 % ophthalmic solution Place 1 drop into both eyes every 12 (twelve) hours.   Yes [provider]  Calcium Carbonate-Vitamin D (CALTRATE 600+D PO) Take by mouth.   Yes [provider]  D-MANNOSE PO Take 2 capsules by mouth daily.   Yes [provider]  estradiol (ESTRACE) 0.1 MG/GM vaginal cream Pleace pea size amount to urethra twice a week. 07/10/20  Yes [provider]  furosemide (LASIX) 20 MG tablet Take by mouth. 07/10/20  Yes [provider]  Magnesium 300 MG CAPS Take by mouth daily.   Yes [provider]  Multiple Vitamins-Minerals (CENTRUM SILVER ULTRA WOMENS PO) Take by mouth daily.   Yes [provider]  Multiple Vitamins-Minerals (PRESERVISION AREDS 2) CAPS  01/13/19  Yes [provider]  Nutritional Supplements (ECHINACEA/GOLDEN SEAL PO) Take by mouth daily.   Yes [provider]  OMEGA-3 KRILL OIL PO Take by mouth daily.   Yes [provider]  omeprazole (PRILOSEC) 20 MG  capsule  05/31/19  Yes [provider]  potassium chloride (K-DUR) 10 MEQ tablet  05/31/19  Yes [provider]  Probiotic Product (CVS PROBIOTIC MAXIMUM STRENGTH) CAPS  01/13/17  Yes [provider]  topiramate (TOPAMAX) 25 MG tablet Take by mouth. 03/08/20 10/23/21 Yes [provider]  valsartan-hydrochlorothiazide (DIOVAN-HCT) 160-12.5 MG tablet  05/31/19  Yes [provider]    Allergies as of 09/19/2021 - Review Complete 07/14/2020  Allergen Reaction Noted   Codeine Nausea Only 07/18/2015   Sulfamethoxazole-trimethoprim Nausea Only 07/13/2019   Doxycycline  01/21/2018   Sulfa antibiotics Nausea Only 07/18/2015    Family History  Problem Relation Age of Onset   Breast cancer Neg Hx     Social History   Socioeconomic History   Marital status: Married    Spouse name: Not on file   Number of children: Not on file   Years of education: Not on file   Highest education level: Not on file  Occupational History   Not on file  Tobacco Use   Smoking status: Never   Smokeless tobacco: Never  Vaping Use   Vaping Use: Never used  Substance and Sexual Activity   Alcohol use: Not Currently   Drug use: Never   Sexual activity: Yes    Birth control/protection: Post-menopausal  Other Topics Concern   Not on file  Social History Narrative   Not on file   Social Determinants of Health  Financial Resource Strain: Not on file  Food Insecurity: Not on file  Transportation Needs: Not on file  Physical Activity: Not on file  Stress: Not on file  Social Connections: Not on file  Intimate Partner Violence: Not on file    Review of Systems: See HPI, otherwise negative ROS  Physical Exam: BP (!) 150/76   Pulse (!) 59   Temp 97.9 F (36.6 C) (Temporal)   Ht 4\' 10"  (1.473 m)   Wt 55.8 kg   SpO2 99%   BMI 25.71 kg/m  General:   Alert, cooperative in NAD Head:  Normocephalic and atraumatic. Respiratory:  Normal work of  breathing. Cardiovascular:  RRR  Impression/Plan: Shelley Hall is here for cataract surgery.  Risks, benefits, limitations, and alternatives regarding cataract surgery have been reviewed with the patient.  Questions have been answered.  All parties agreeable.   Norvel Richards, MD  10/23/2021, 8:56 AM

## 2021-10-23 NOTE — Transfer of Care (Signed)
Immediate Anesthesia Transfer of Care Note  Patient: Shelley Hall  Procedure(s) Performed: CATARACT EXTRACTION PHACO AND INTRAOCULAR LENS PLACEMENT (IOC) LEFT PANOPTIX LENS (Left: Eye)  Patient Location: PACU  Anesthesia Type: MAC  Level of Consciousness: awake, alert  and patient cooperative  Airway and Oxygen Therapy: Patient Spontanous Breathing and Patient connected to supplemental oxygen  Post-op Assessment: Post-op Vital signs reviewed, Patient's Cardiovascular Status Stable, Respiratory Function Stable, Patent Airway and No signs of Nausea or vomiting  Post-op Vital Signs: Reviewed and stable  Complications: No notable events documented.

## 2021-10-23 NOTE — Op Note (Signed)
PREOPERATIVE DIAGNOSIS:  Nuclear sclerotic cataract of the left eye.   POSTOPERATIVE DIAGNOSIS:  Nuclear sclerotic cataract of the left eye.   OPERATIVE PROCEDURE: Procedure(s): CATARACT EXTRACTION PHACO AND INTRAOCULAR LENS PLACEMENT (IOC) LEFT PANOPTIX LENS   SURGEON:  Galen Manila, MD.   ANESTHESIA: 1.      Managed anesthesia care. 2.     0.51ml os Shugarcaine was instilled following the paracentesis [email or web address]@   COMPLICATIONS:  None.   TECHNIQUE:   Stop and chop    DESCRIPTION OF PROCEDURE:  The patient was examined and consented in the preoperative holding area where the aforementioned topical anesthesia was applied to the left eye.  The patient was brought back to the Operating Room where he was sat upright on the gurney and given a target to fixate upon while the eye was marked at the 3:00 and 9:00 position.  The patient was then reclined on the operating table.  The eye was prepped and draped in the usual sterile ophthalmic fashion and a lid speculum was placed. A paracentesis was created with the side port blade and the anterior chamber was filled with viscoelastic. A near clear corneal incision was performed with the steel keratome. A continuous curvilinear capsulorrhexis was performed with a cystotome followed by the capsulorrhexis forceps. Hydrodissection and hydrodelineation were carried out with BSS on a blunt cannula. The lens was removed in a stop and chop technique and the remaining cortical material was removed with the irrigation-aspiration handpiece. The eye was inflated with viscoelastic and the ZCT lens was placed in the eye and rotated to within a few degrees of the predetermined orientation.  The remaining viscoelastic was removed from the eye.  The Sinskey hook was used to rotate the toric lens into its final resting place at 167 degrees.  0.1 ml of Vigamox was placed in the anterior chamber. The eye was inflated to a physiologic pressure and found to be watertight.   The eye was dressed with Vigamox. The patient was given protective glasses to wear throughout the day and a shield with which to sleep tonight. The patient was also given drops with which to begin a drop regimen today and will follow-up with me in one day. Implant Name Type Inv. Item Serial No. Manufacturer Lot No. LRB No. Used Action  LENS IOL PANOPTIX TORIC 21.0 - [id number]  LENS IOL PANOPTIX TORIC 21.0 [id number] ALCON  Left 1 Implanted   Procedure(s): CATARACT EXTRACTION PHACO AND INTRAOCULAR LENS PLACEMENT (IOC) LEFT PANOPTIX LENS (Left)  Electronically signed: Galen Manila 10/25/20229:22 AM

## 2021-10-23 NOTE — Anesthesia Postprocedure Evaluation (Signed)
Anesthesia Post Note  Patient: Shelley Hall  Procedure(s) Performed: CATARACT EXTRACTION PHACO AND INTRAOCULAR LENS PLACEMENT (IOC) LEFT PANOPTIX LENS (Left: Eye)     Patient location during evaluation: PACU Anesthesia Type: MAC Level of consciousness: awake and alert Pain management: pain level controlled Vital Signs Assessment: post-procedure vital signs reviewed and stable Respiratory status: spontaneous breathing, nonlabored ventilation, respiratory function stable and patient connected to nasal cannula oxygen Cardiovascular status: stable and blood pressure returned to baseline Postop Assessment: no apparent nausea or vomiting Anesthetic complications: no   No notable events documented.  Fidel Levy

## 2021-10-24 ENCOUNTER — Encounter: Payer: Self-pay | Admitting: Ophthalmology

## 2021-11-05 NOTE — Discharge Instructions (Signed)

## 2021-11-06 ENCOUNTER — Encounter
Admission: RE | Disposition: A | Discharge status: Home / Self Care | Payer: Self-pay | Source: Ambulatory Visit | Attending: Ophthalmology

## 2021-11-06 ENCOUNTER — Ambulatory Visit: Payer: Medicare Other | Admitting: Anesthesiology

## 2021-11-06 ENCOUNTER — Encounter: Payer: Self-pay | Admitting: Ophthalmology

## 2021-11-06 ENCOUNTER — Other Ambulatory Visit: Payer: Self-pay

## 2021-11-06 ENCOUNTER — Ambulatory Visit
Admission: RE | Admit: 2021-11-06 | Discharge: 2021-11-06 | Disposition: A | Discharge status: Home / Self Care | Payer: Medicare Other | Source: Ambulatory Visit | Attending: Ophthalmology | Admitting: Ophthalmology

## 2021-11-06 DIAGNOSIS — K219 Gastro-esophageal reflux disease without esophagitis: Secondary | ICD-10-CM | POA: Insufficient documentation

## 2021-11-06 DIAGNOSIS — H2511 Age-related nuclear cataract, right eye: Secondary | ICD-10-CM | POA: Insufficient documentation

## 2021-11-06 DIAGNOSIS — M1990 Unspecified osteoarthritis, unspecified site: Secondary | ICD-10-CM | POA: Diagnosis not present

## 2021-11-06 DIAGNOSIS — I10 Essential (primary) hypertension: Secondary | ICD-10-CM | POA: Insufficient documentation

## 2021-11-06 DIAGNOSIS — R519 Headache, unspecified: Secondary | ICD-10-CM | POA: Insufficient documentation

## 2021-11-06 DIAGNOSIS — H409 Unspecified glaucoma: Secondary | ICD-10-CM | POA: Diagnosis not present

## 2021-11-06 HISTORY — PX: CATARACT EXTRACTION W/PHACO: SHX586

## 2021-11-06 SURGERY — PHACOEMULSIFICATION, CATARACT, WITH IOL INSERTION
Anesthesia: Monitor Anesthesia Care | Site: Eye | Laterality: Right

## 2021-11-06 MED ORDER — TETRACAINE HCL 0.5 % OP SOLN
1.0000 [drp] | OPHTHALMIC | Status: DC | PRN
  Administered 2021-11-06 (×3): 1 [drp] via OPHTHALMIC

## 2021-11-06 MED ORDER — BRIMONIDINE TARTRATE-TIMOLOL 0.2-0.5 % OP SOLN
OPHTHALMIC | Status: DC | PRN
  Administered 2021-11-06: 1 [drp] via OPHTHALMIC

## 2021-11-06 MED ORDER — ACETAMINOPHEN 160 MG/5ML PO SOLN: 325.0000 mg | ORAL | Status: DC | PRN

## 2021-11-06 MED ORDER — MIDAZOLAM HCL 2 MG/2ML IJ SOLN
INTRAMUSCULAR | Status: DC | PRN
  Administered 2021-11-06: 1 mg via INTRAVENOUS

## 2021-11-06 MED ORDER — ONDANSETRON HCL 4 MG/2ML IJ SOLN: 4.0000 mg | Freq: Once | INTRAMUSCULAR | Status: DC | PRN

## 2021-11-06 MED ORDER — CEFUROXIME OPHTHALMIC INJECTION 1 MG/0.1 ML
INJECTION | OPHTHALMIC | Status: DC | PRN
  Administered 2021-11-06: 0.1 mL via INTRACAMERAL

## 2021-11-06 MED ORDER — SIGHTPATH DOSE#1 NA CHONDROIT SULF-NA HYALURON 40-17 MG/ML IO SOLN
INTRAOCULAR | Status: DC | PRN
  Administered 2021-11-06: 1 mL via INTRAOCULAR

## 2021-11-06 MED ORDER — ARMC OPHTHALMIC DILATING DROPS
1.0000 "application " | OPHTHALMIC | Status: DC | PRN
  Administered 2021-11-06 (×3): 1 via OPHTHALMIC

## 2021-11-06 MED ORDER — SIGHTPATH DOSE#1 BSS IO SOLN
INTRAOCULAR | Status: DC | PRN
  Administered 2021-11-06: 1 mL

## 2021-11-06 MED ORDER — ACETAMINOPHEN 325 MG PO TABS: 325.0000 mg | ORAL_TABLET | ORAL | Status: DC | PRN

## 2021-11-06 MED ORDER — FENTANYL CITRATE (PF) 100 MCG/2ML IJ SOLN
INTRAMUSCULAR | Status: DC | PRN
  Administered 2021-11-06: 50 ug via INTRAVENOUS

## 2021-11-06 MED ORDER — LACTATED RINGERS IV SOLN: INTRAVENOUS | Status: DC

## 2021-11-06 MED ORDER — SIGHTPATH DOSE#1 BSS IO SOLN
INTRAOCULAR | Status: DC | PRN
  Administered 2021-11-06: 88 mL via OPHTHALMIC

## 2021-11-06 MED ORDER — SIGHTPATH DOSE#1 BSS IO SOLN
INTRAOCULAR | Status: DC | PRN
  Administered 2021-11-06: 15 mL

## 2021-11-06 SURGICAL SUPPLY — 19 items
CANNULA ANT/CHMB 27GA (MISCELLANEOUS) IMPLANT
GLOVE SURG ENC TEXT LTX SZ8 (GLOVE) ×3 IMPLANT
GLOVE SURG TRIUMPH 8.0 PF LTX (GLOVE) ×3 IMPLANT
GOWN STRL REUS W/ TWL LRG LVL3 (GOWN DISPOSABLE) ×2 IMPLANT
GOWN STRL REUS W/TWL LRG LVL3 (GOWN DISPOSABLE) ×6
LENS IOL IQ PAN TRC 30 21.0 ×1 IMPLANT
LENS IOL PANOP TORIC 30 21.0 ×1 IMPLANT
LENS IOL PANOPTIX TORIC 21.0 ×3 IMPLANT
MARKER SKIN DUAL TIP RULER LAB (MISCELLANEOUS) IMPLANT
NEEDLE FILTER BLUNT 18X 1/2SAF (NEEDLE) ×2
NEEDLE FILTER BLUNT 18X1 1/2 (NEEDLE) ×1 IMPLANT
PACK EYE AFTER SURG (MISCELLANEOUS) IMPLANT
RING MALYGIN (MISCELLANEOUS) IMPLANT
SUT ETHILON 10-0 CS-B-6CS-B-6 (SUTURE)
SUTURE EHLN 10-0 CS-B-6CS-B-6 (SUTURE) IMPLANT
SYR 3ML LL SCALE MARK (SYRINGE) ×3 IMPLANT
SYR TB 1ML LUER SLIP (SYRINGE) ×3 IMPLANT
WATER STERILE IRR 250ML POUR (IV SOLUTION) ×3 IMPLANT
WIPE NON LINTING 3.25X3.25 (MISCELLANEOUS) IMPLANT

## 2021-11-06 NOTE — Op Note (Signed)
PREOPERATIVE DIAGNOSIS:  Nuclear sclerotic cataract of the right eye.   POSTOPERATIVE DIAGNOSIS:  Nuclear sclerotic cataract of the right eye.   OPERATIVE PROCEDURE: Procedure(s): CATARACT EXTRACTION PHACO AND INTRAOCULAR LENS PLACEMENT (I[facility]) RIGHT PANOPTIX LENS   SURGEON:  Galen Manila, MD.   ANESTHESIA: 1.      Managed anesthesia care. 2.     0.39ml of Shugarcaine was instilled following the paracentesis  Anesthesiologist: Edwyna Ready, MD CRNA: Lily Kocher, CRNA  COMPLICATIONS:  None.   TECHNIQUE:   Stop and chop    DESCRIPTION OF PROCEDURE:  The patient was examined and consented in the preoperative holding area where the aforementioned topical anesthesia was applied to the right eye.  The patient was brought back to the Operating Room where he was sat upright on the gurney and given a target to fixate upon while the eye was marked at the 3:00 and 9:00 position.  The patient was then reclined on the operating table.  The eye was prepped and draped in the usual sterile ophthalmic fashion and a lid speculum was placed. A paracentesis was created with the side port blade and the anterior chamber was filled with viscoelastic. A near clear corneal incision was performed with the steel keratome. A continuous curvilinear capsulorrhexis was performed with a cystotome followed by the capsulorrhexis forceps. Hydrodissection and hydrodelineation were carried out with BSS on a blunt cannula. The lens was removed in a stop and chop technique and the remaining cortical material was removed with the irrigation-aspiration handpiece. The eye was inflated with viscoelastic and the ZCT  lens  was placed in the eye and rotated to within a few degrees of the predetermined orientation.  The remaining viscoelastic was removed from the eye.  The Sinskey hook was used to rotate the toric lens into its final resting place at 024 degrees.  0. The eye was inflated to a physiologic pressure and found to be  watertight. 0.42ml of Vigamox was placed in the anterior chamber.  The eye was dressed with Vigamox. The patient was given protective glasses to wear throughout the day and a shield with which to sleep tonight. The patient was also given drops with which to begin a drop regimen today and will follow-up with me in one day. Implant Name Type Inv. Item Serial No. Manufacturer Lot No. LRB No. Used Action  LENS IOL PANOPTIX TORIC 21.0 - [id number]  LENS IOL PANOPTIX TORIC 21.0 [id number] ALCON  Right 1 Implanted   Procedure(s) with comments: CATARACT EXTRACTION PHACO AND INTRAOCULAR LENS PLACEMENT ([facility]) RIGHT PANOPTIX LENS (Right) - 8.31 0:55.9  Electronically signed: Galen Manila 11/06/2021 10:31 AM

## 2021-11-06 NOTE — H&P (Signed)
[facility]   Primary Care Physician:  Lauro Regulus, MD Ophthalmologist: Dr. Druscilla Brownie  Pre-Procedure History & Physical: HPI:  Shelley Hall is a 75 y.o. female here for cataract surgery.   Past Medical History:  Diagnosis Date   Arthritis    Female bladder prolapse    GERD (gastroesophageal reflux disease)    Glaucoma    Hypertension    PONV (postoperative nausea and vomiting)    after hand surgery 2015   Recurrent UTI     Past Surgical History:  Procedure Laterality Date   BREAST CYST ASPIRATION Right 1989   CATARACT EXTRACTION W/PHACO Left 10/23/2021   Procedure: CATARACT EXTRACTION PHACO AND INTRAOCULAR LENS PLACEMENT ([facility]) LEFT PANOPTIX LENS;  Surgeon: Galen Manila, MD;  Location: [facility];  Service: Ophthalmology;  Laterality: Left;  4.98 00:34.9   HAND SURGERY      Prior to Admission medications   Medication Sig Start Date End Date Taking? Authorizing Provider  aspirin EC 81 MG tablet Take by mouth. 01/08/99  Yes [provider]  AZO-CRANBERRY PO Take by mouth in the morning and at bedtime.   Yes [provider]  BIOTIN PO Take by mouth daily.   Yes [provider]  Calcium Carbonate-Vitamin D (CALTRATE 600+D PO) Take by mouth.   Yes [provider]  D-MANNOSE PO Take 2 capsules by mouth daily.   Yes [provider]  furosemide (LASIX) 20 MG tablet Take by mouth. 07/10/20  Yes [provider]  Magnesium 300 MG CAPS Take by mouth daily.   Yes [provider]  Multiple Vitamins-Minerals (CENTRUM SILVER ULTRA WOMENS PO) Take by mouth daily.   Yes [provider]  Multiple Vitamins-Minerals (PRESERVISION AREDS 2) CAPS  01/13/19  Yes [provider]  Nutritional Supplements (ECHINACEA/GOLDEN SEAL PO) Take by mouth daily.   Yes [provider]  OMEGA-3 KRILL OIL PO Take by mouth daily.   Yes [provider]  omeprazole (PRILOSEC) 20 MG capsule   05/31/19  Yes [provider]  potassium chloride (K-DUR) 10 MEQ tablet  05/31/19  Yes [provider]  Probiotic Product ([employer]S PROBIOTIC MAXIMUM STRENGTH) CAPS  01/13/17  Yes [provider]  valsartan-hydrochlorothiazide (DIOVAN-HCT) 160-12.5 MG tablet  05/31/19  Yes [provider]  brimonidine-timolol (COMBIGAN) 0.2-0.5 % ophthalmic solution Place 1 drop into both eyes every 12 (twelve) hours.    [provider]  estradiol (ESTRACE) 0.1 MG/GM vaginal cream Pleace pea size amount to urethra twice a week. 07/10/20   [provider]  topiramate (TOPAMAX) 25 MG tablet Take by mouth. 03/08/20 10/23/21  [provider]    Allergies as of 09/19/2021 - Review Complete 07/14/2020  Allergen Reaction Noted   Codeine Nausea Only 07/18/2015   Sulfamethoxazole-trimethoprim Nausea Only 07/13/2019   Doxycycline  01/21/2018   Sulfa antibiotics Nausea Only 07/18/2015    Family History  Problem Relation Age of Onset   Breast cancer Neg Hx     Social History   Socioeconomic History   Marital status: Married    Spouse name: Not on file   Number of children: Not on file   Years of education: Not on file   Highest education level: Not on file  Occupational History   Not on file  Tobacco Use   Smoking status: Never   Smokeless tobacco: Never  Vaping Use   Vaping Use: Never used  Substance and Sexual Activity   Alcohol use: Not Currently  Drug use: Never   Sexual activity: Yes    Birth control/protection: Post-menopausal  Other Topics Concern   Not on file  Social History Narrative   Not on file   Social Determinants of Health   Financial Resource Strain: Not on file  Food Insecurity: Not on file  Transportation Needs: Not on file  Physical Activity: Not on file  Stress: Not on file  Social Connections: Not on file  Intimate Partner Violence: Not on file    Review of Systems: See HPI, otherwise negative ROS  Physical  Exam: BP 118/70   Pulse 69   Temp 97.7 F (36.5 C) (Temporal)   Resp 14   Ht 4\' 10"  (1.473 m)   Wt 53.5 kg   SpO2 98%   BMI 24.66 kg/m  General:   Alert, cooperative in NAD Head:  Normocephalic and atraumatic. Respiratory:  Normal work of breathing. Cardiovascular:  RRR  Impression/Plan: Shelley Hall is here for cataract surgery.  Risks, benefits, limitations, and alternatives regarding cataract surgery have been reviewed with the patient.  Questions have been answered.  All parties agreeable.   Norvel Richards, MD  11/06/2021, 10:00 AM

## 2021-11-06 NOTE — Anesthesia Postprocedure Evaluation (Signed)
Anesthesia Post Note  Patient: Shelley Hall  Procedure(s) Performed: CATARACT EXTRACTION PHACO AND INTRAOCULAR LENS PLACEMENT ([place]) RIGHT PANOPTIX LENS (Right: Eye)     Patient location during evaluation: PACU Anesthesia Type: MAC Level of consciousness: awake and alert Pain management: pain level controlled Vital Signs Assessment: post-procedure vital signs reviewed and stable Respiratory status: spontaneous breathing, nonlabored ventilation, respiratory function stable and patient connected to nasal cannula oxygen Cardiovascular status: blood pressure returned to baseline and stable Postop Assessment: no apparent nausea or vomiting Anesthetic complications: no   No notable events documented.  Sinda Du

## 2021-11-06 NOTE — Anesthesia Preprocedure Evaluation (Signed)
Anesthesia Evaluation  Patient identified by MRN, date of birth, ID band Patient awake    Reviewed: NPO status   History of Anesthesia Complications (+) PONV and history of anesthetic complications  Airway Mallampati: II  TM Distance: >3 FB Neck ROM: full    Dental no notable dental hx.    Pulmonary neg pulmonary ROS,    Pulmonary exam normal breath sounds clear to auscultation       Cardiovascular Exercise Tolerance: Good hypertension, Normal cardiovascular exam Rhythm:Regular Rate:Normal     Neuro/Psych  Headaches, glaucoma negative psych ROS   GI/Hepatic Neg liver ROS, GERD  Controlled,  Endo/Other  negative endocrine ROS  Renal/GU negative Renal ROS  negative genitourinary   Musculoskeletal  (+) Arthritis ,   Abdominal Normal abdominal exam  (+) - obese, scaphoid  Abdomen: soft.    Peds  Hematology negative hematology ROS (+)   Anesthesia Other Findings   Reproductive/Obstetrics                             Anesthesia Physical  Anesthesia Plan  ASA: 3  Anesthesia Plan: MAC   Post-op Pain Management:    Induction:   PONV Risk Score and Plan: 3 and Midazolam, TIVA, Ondansetron and Treatment may vary due to age or medical condition  Airway Management Planned: Natural Airway and Nasal Cannula  Additional Equipment:   Intra-op Plan:   Post-operative Plan:   Informed Consent: I have reviewed the patients History and Physical, chart, labs and discussed the procedure including the risks, benefits and alternatives for the proposed anesthesia with the patient or authorized representative who has indicated his/her understanding and acceptance.       Plan Discussed with: CRNA  Anesthesia Plan Comments:         Anesthesia Quick Evaluation  Patient Active Problem List   Diagnosis Date Noted  . Hand joint stiff 07/13/2019  . Hand joint pain 07/13/2019  . Closed  fracture of metacarpal bone 07/13/2019  . Closed fracture of proximal phalanx of left middle finger 07/13/2019  . Dysuria 10/01/2017  . Incomplete emptying of bladder 06/19/2016  . History of nephrolithiasis 06/19/2016  . Hematuria, microscopic 06/19/2016  . Chronic cystitis 06/19/2016  . Health care maintenance 06/19/2015  . Osteoporosis 05/29/2014  . HTN (hypertension), benign 05/29/2014  . GERD (gastroesophageal reflux disease) 05/29/2014  . Abnormal Pap smear of cervix 05/29/2014  . Female bladder prolapse 05/30/2006  . Recurrent UTI 01/13/1997    CBC Latest Ref Rng & Units 01/12/2013  WBC 3.6 - 11.0 x10 3/mm 3 5.8  Hemoglobin 12.0 - 16.0 g/dL 13.5  Hematocrit 35.0 - 47.0 % 40.4  Platelets 150 - 440 x10 3/mm 3 211   No flowsheet data found.  Risks and benefits of anesthesia discussed at length, patient or surrogate demonstrates understanding. Appropriately NPO. Plan to proceed with anesthesia.  Champ Mungo, MD 11/06/21

## 2021-11-06 NOTE — Transfer of Care (Signed)
Immediate Anesthesia Transfer of Care Note  Patient: Shelley Hall  Procedure(s) Performed: CATARACT EXTRACTION PHACO AND INTRAOCULAR LENS PLACEMENT ([facility]) RIGHT PANOPTIX LENS (Right: Eye)  Patient Location: PACU  Anesthesia Type: MAC  Level of Consciousness: awake, alert  and patient cooperative  Airway and Oxygen Therapy: Patient Spontanous Breathing and Patient connected to supplemental oxygen  Post-op Assessment: Post-op Vital signs reviewed, Patient's Cardiovascular Status Stable, Respiratory Function Stable, Patent Airway and No signs of Nausea or vomiting  Post-op Vital Signs: Reviewed and stable  Complications: No notable events documented.

## 2021-11-06 NOTE — Anesthesia Procedure Notes (Signed)
Procedure Name: MAC Date/Time: 11/06/2021 10:08 AM Performed by: Dionne Bucy, CRNA Pre-anesthesia Checklist: Patient identified, Emergency Drugs available, Suction available, Patient being monitored and Timeout performed Patient Re-evaluated:Patient Re-evaluated prior to induction Oxygen Delivery Method: Nasal cannula Placement Confirmation: positive ETCO2

## 2021-11-07 ENCOUNTER — Encounter: Payer: Self-pay | Admitting: Ophthalmology

## 2021-12-04 ENCOUNTER — Telehealth: Payer: Self-pay | Admitting: *Deleted

## 2021-12-04 NOTE — Progress Notes (Incomplete)
12/04/21 4:43 PM   Shelley Hall 03-11-1946 [id number]  Referring provider:  Lauro Regulus, MD [address] [facility] [place],  [place] [address] No chief complaint on file.    HPI: Shelley Hall is a 75 y.o.female with a personal history of recurrent UTIs, who presents today for a 1 year follow-up.         PMH: Past Medical History:  Diagnosis Date   Arthritis    Female bladder prolapse    GERD (gastroesophageal reflux disease)    Glaucoma    Hypertension    PONV (postoperative nausea and vomiting)    after hand surgery 2015   Recurrent UTI     Surgical History: Past Surgical History:  Procedure Laterality Date   BREAST CYST ASPIRATION Right 1989   CATARACT EXTRACTION W/PHACO Left 10/23/2021   Procedure: CATARACT EXTRACTION PHACO AND INTRAOCULAR LENS PLACEMENT ([facility]) LEFT PANOPTIX LENS;  Surgeon: Galen Manila, MD;  Location: [facility];  Service: Ophthalmology;  Laterality: Left;  4.98 00:34.9   CATARACT EXTRACTION W/PHACO Right 11/06/2021   Procedure: CATARACT EXTRACTION PHACO AND INTRAOCULAR LENS PLACEMENT (IOC) RIGHT PANOPTIX LENS;  Surgeon: Galen Manila, MD;  Location: [facility];  Service: Ophthalmology;  Laterality: Right;  8.31 0:55.9   HAND SURGERY      Home Medications:  Allergies as of 12/05/2021       Reactions   Codeine Nausea Only   Sulfamethoxazole-trimethoprim Nausea Only   Doxycycline    Other reaction(s): Headache Headaches Pt has had same headaches off medication   Sulfa Antibiotics Nausea Only        Medication List        Accurate as of December 04, 2021  4:43 PM. If you have any questions, ask your nurse or doctor.          aspirin EC 81 MG tablet Take by mouth.   AZO-CRANBERRY PO Take by mouth in the morning and at bedtime.   BIOTIN PO Take by mouth daily.   brimonidine-timolol 0.2-0.5 % ophthalmic solution Commonly known as: COMBIGAN Place 1 drop into both  eyes every 12 (twelve) hours.   CALTRATE 600+D PO Take by mouth.   [employer]S Probiotic Maximum Strength Caps   D-MANNOSE PO Take 2 capsules by mouth daily.   ECHINACEA/GOLDEN SEAL PO Take by mouth daily.   estradiol 0.1 MG/GM vaginal cream Commonly known as: ESTRACE Pleace pea size amount to urethra twice a week.   furosemide 20 MG tablet Commonly known as: LASIX Take by mouth.   Magnesium 300 MG Caps Take by mouth daily.   OMEGA-3 KRILL OIL PO Take by mouth daily.   omeprazole 20 MG capsule Commonly known as: PRILOSEC   potassium chloride 10 MEQ tablet Commonly known as: KLOR-CON M   CENTRUM SILVER ULTRA WOMENS PO Take by mouth daily.   PreserVision AREDS 2 Caps   topiramate 25 MG tablet Commonly known as: TOPAMAX Take by mouth.   valsartan-hydrochlorothiazide 160-12.5 MG tablet Commonly known as: DIOVAN-HCT        Allergies:  Allergies  Allergen Reactions   Codeine Nausea Only   Sulfamethoxazole-Trimethoprim Nausea Only   Doxycycline     Other reaction(s): Headache Headaches Pt has had same headaches off medication    Sulfa Antibiotics Nausea Only    Family History: Family History  Problem Relation Age of Onset   Breast cancer Neg Hx     Social History:  reports that she has never  smoked. She has never used smokeless tobacco. She reports that she does not currently use alcohol. She reports that she does not use drugs.   Physical Exam: There were no vitals taken for this visit.  Constitutional:  Alert and oriented, No acute distress. HEENT: [place] AT, moist mucus membranes.  Trachea midline, no masses. Cardiovascular: No clubbing, cyanosis, or edema. Respiratory: Normal respiratory effort, no increased work of breathing. Skin: No rashes, bruises or suspicious lesions. Neurologic: Grossly intact, no focal deficits, moving all 4 extremities. Psychiatric: Normal mood and affect.  Urinalysis   Pertinent Imaging:    Assessment & Plan:     No  follow-ups on file.  I,Kailey Littlejohn,acting as a Neurosurgeon for [name], MD.,have documented all relevant documentation on the behalf of [name], MD,as directed by  [name], MD while in the presence of [name], MD.  [facility] [address], [address] [place], [place] [address] [phone]

## 2021-12-04 NOTE — Telephone Encounter (Signed)
Patient requested appointment with Dr. Apolinar Junes for 1 year follow up recurrent UTI-prefer a female physician. Denies having any active UTI symptoms. Will call back if symptoms do occur. Patient verbalized understanding.

## 2021-12-05 ENCOUNTER — Ambulatory Visit: Payer: Medicare Other | Admitting: Urology

## 2022-02-07 NOTE — Progress Notes (Signed)
02/10/22 3:54 PM   Shelley Hall 06-21-1946 [id number]  Referring provider:  Lauro Regulus, MD [address] [facility] [place],  [place] [address] Chief Complaint  Patient presents with   Recurrent UTI     HPI: Shelley Hall is a 76 y.o.female with a personal history of recurrent lower tract UTIs who presents today for further evaluation of rUTIs.   CT urogram in 2017 showed no upper tract abnormalities.  She has previously been followed by Dr. Achilles Dunk at [facility] and last saw him in April 2019. Prior PVRs at [facility] were minimal.   She was last seen in clinic on 07/14/2020 by Dr.Stoioff who recommended D-mannose and cranberry supplementation as was as continuation of estrace cream.     Her preference is to follow-up with me as I am her husband's urologist as well.  In December of 2022 she had 2 documented UTIs, urine culture on 12/04/2021 grew E.coli that was ampicillin, ciprofloxacin, and levofloxacin resistant.  She reports on this occasion, she was completely asymptomatic other than foul-smelling urine.  She was having her labs checked for routine labs and mentioned to her provider that her urine was malodorous.  She did be treated for this.  Culture on 12/19/2021 grew E.coli that was ampicillin, ciprofloxacin, and levofloxacin resistant.   Her most recent urinalysis on 02/01/2022 showed small blood, small leukocytes, >50 WBCs, 4-10 RBCs, and moderate bacteria.  Associated urine culture grew E. coli resistant to Levaquin/Cipro and ampicillin.  Sensitive to Augmentin and all other antibiotics.  On his last occasion, she is actually symptomatic including dysuria, urgency frequency but reports today on the other occasions, she was really having that many symptoms as outlined above.  She was treated with a total of 7 days of Macrobid but feels like this was not enough and has needed longer courses in the past.  She just completed a course of nitrofurantoin, last dose  today.  She also was prescribed Diflucan for thrush.  UA today is negative  PVR 0    PMH: Past Medical History:  Diagnosis Date   Arthritis    Female bladder prolapse    GERD (gastroesophageal reflux disease)    Glaucoma    Hypertension    PONV (postoperative nausea and vomiting)    after hand surgery 2015   Recurrent UTI     Surgical History: Past Surgical History:  Procedure Laterality Date   BREAST CYST ASPIRATION Right 1989   CATARACT EXTRACTION W/PHACO Left 10/23/2021   Procedure: CATARACT EXTRACTION PHACO AND INTRAOCULAR LENS PLACEMENT (IOC) LEFT PANOPTIX LENS;  Surgeon: Galen Manila, MD;  Location: [facility];  Service: Ophthalmology;  Laterality: Left;  4.98 00:34.9   CATARACT EXTRACTION W/PHACO Right 11/06/2021   Procedure: CATARACT EXTRACTION PHACO AND INTRAOCULAR LENS PLACEMENT ([facility]) RIGHT PANOPTIX LENS;  Surgeon: Galen Manila, MD;  Location: [facility];  Service: Ophthalmology;  Laterality: Right;  8.31 0:55.9   HAND SURGERY      Home Medications:  Allergies as of 02/08/2022       Reactions   Codeine Nausea Only   Sulfamethoxazole-trimethoprim Nausea Only   Doxycycline    Other reaction(s): Headache Headaches Pt has had same headaches off medication   Sulfa Antibiotics Nausea Only        Medication List        Accurate as of February 08, 2022 11:59 PM. If you have any questions, ask your nurse or doctor.  STOP taking these medications    topiramate 25 MG tablet Commonly known as: TOPAMAX Stopped by: [name], MD       TAKE these medications    aspirin EC 81 MG tablet Take by mouth.   AZO-CRANBERRY PO Take by mouth in the morning and at bedtime.   BIOTIN PO Take by mouth daily.   brimonidine-timolol 0.2-0.5 % ophthalmic solution Commonly known as: COMBIGAN Place 1 drop into both eyes every 12 (twelve) hours.   CALTRATE 600+D PO Take by mouth.   [employer] Probiotic Maximum Strength Caps    D-MANNOSE PO Take 2 capsules by mouth daily.   ECHINACEA/GOLDEN SEAL PO Take by mouth daily.   estradiol 0.1 MG/GM vaginal cream Commonly known as: ESTRACE Pleace pea size amount to urethra twice a week.   fluconazole 100 MG tablet Commonly known as: DIFLUCAN Take by mouth.   furosemide 20 MG tablet Commonly known as: LASIX Take by mouth.   Magnesium 300 MG Caps Take by mouth daily.   nitrofurantoin (macrocrystal-monohydrate) 100 MG capsule Commonly known as: MACROBID Take 1 capsule by mouth 2 (two) times daily. What changed: Another medication with the same name was added. Make sure you understand how and when to take each. Changed by: [name], MD   nitrofurantoin (macrocrystal-monohydrate) 100 MG capsule Commonly known as: Macrobid Take 1 capsule (100 mg total) by mouth 2 (two) times daily. What changed: You were already taking a medication with the same name, and this prescription was added. Make sure you understand how and when to take each. Changed by: [name], MD   OMEGA-3 KRILL OIL PO Take by mouth daily.   omeprazole 20 MG capsule Commonly known as: PRILOSEC   potassium chloride 10 MEQ tablet Commonly known as: KLOR-CON M Take 1 tablet by mouth daily.   CENTRUM SILVER ULTRA WOMENS PO Take by mouth daily.   PreserVision AREDS 2 Caps   saccharomyces boulardii 250 MG capsule Commonly known as: FLORASTOR Take by mouth.   triamcinolone cream 0.1 % Commonly known as: KENALOG See admin instructions.   valsartan-hydrochlorothiazide 160-12.5 MG tablet Commonly known as: DIOVAN-HCT        Allergies:  Allergies  Allergen Reactions   Codeine Nausea Only   Sulfamethoxazole-Trimethoprim Nausea Only   Doxycycline     Other reaction(s): Headache Headaches Pt has had same headaches off medication    Sulfa Antibiotics Nausea Only    Family History: Family History  Problem Relation Age of Onset   Breast cancer Neg Hx     Social  History:  reports that she has never smoked. She has never used smokeless tobacco. She reports that she does not currently use alcohol. She reports that she does not use drugs.   Physical Exam: BP 110/67 (BP Location: Left Arm, Patient Position: Sitting, Cuff Size: Normal)    Pulse 61    Ht 4\' 10"  (1.473 m)    Wt 124 lb 9.6 oz (56.5 kg)    BMI 26.04 kg/m   Constitutional:  Alert and oriented, No acute distress. HEENT: [place] AT, moist mucus membranes.  Trachea midline, no masses. Cardiovascular: No clubbing, cyanosis, or edema. Respiratory: Normal respiratory effort, no increased work of breathing. Skin: No rashes, bruises or suspicious lesions. Neurologic: Grossly intact, no focal deficits, moving all 4 extremities. Psychiatric: Normal mood and affect.    Urinalysis Results for orders placed or performed during the hospital encounter of 02/08/22  Urinalysis, Complete w Microscopic  Result Value Ref Range  Color, Urine YELLOW YELLOW   APPearance CLEAR CLEAR   Specific Gravity, Urine 1.015 1.005 - 1.030   pH 7.0 5.0 - 8.0   Glucose, UA NEGATIVE NEGATIVE mg/dL   Hgb urine dipstick NEGATIVE NEGATIVE   Bilirubin Urine NEGATIVE NEGATIVE   Ketones, ur NEGATIVE NEGATIVE mg/dL   Protein, ur NEGATIVE NEGATIVE mg/dL   Nitrite NEGATIVE NEGATIVE   Leukocytes,Ua NEGATIVE NEGATIVE   Squamous Epithelial / LPF 0-5 0 - 5   WBC, UA 0-5 0 - 5 WBC/hpf   RBC / HPF 0-5 0 - 5 RBC/hpf   Bacteria, UA FEW (A) NONE SEEN    Assessment & Plan:    1. Recurrent UTI We had a lengthy discussion today about the concept of chronic bacteriuria when to treat her urinary tract infection.  When she was treated in December, she had no symptoms other than malodorous urine and since then, has starting having issues with symptomatic infections true infections.  We discussed antibiotic stewardship and avoidance of antibiotics if and when antibiotics can precipitate a cycle of infections and other complications including  thrush, etc.  Also discussed the concern for antibiotic resistance which she also shares concerns.  She will continue topical estrogen cream, daily probiotic, cranberry tablets and d-mannose.  We did discuss the role of suppression antibiotics and we agreed to try to avoid these.  We will update her upper tract imaging in the form of repeat renal ultrasound given that her last imaging was about 5 years ago.  She is agreeable this plan.  She will follow-up as needed with recurrent UTI symptoms or annually. - Bladder Scan (Post Void Residual) in office - US RENAL; Future   Return in about 1 year (around 02/08/2023) for 49yr follow up rUTI .  [facility] [address] [place], [place] [address] [phone]  I spent 30 I did total minutes on the day of the encounter including pre-visit review of the medical record, face-to-face time with the patient, and post visit ordering of labs/imaging/tests.

## 2022-02-08 ENCOUNTER — Other Ambulatory Visit: Payer: Self-pay | Admitting: *Deleted

## 2022-02-08 ENCOUNTER — Other Ambulatory Visit: Payer: Self-pay

## 2022-02-08 ENCOUNTER — Other Ambulatory Visit
Admission: RE | Admit: 2022-02-08 | Discharge: 2022-02-08 | Disposition: A | Discharge status: Home / Self Care | Payer: Medicare Other | Attending: Urology | Admitting: Urology

## 2022-02-08 ENCOUNTER — Encounter: Payer: Self-pay | Admitting: Urology

## 2022-02-08 ENCOUNTER — Ambulatory Visit: Payer: Medicare Other | Admitting: Urology

## 2022-02-08 VITALS — BP 110/67 | HR 61 | Ht <= 58 in | Wt 124.6 lb

## 2022-02-08 DIAGNOSIS — N390 Urinary tract infection, site not specified: Secondary | ICD-10-CM

## 2022-02-08 LAB — URINALYSIS, COMPLETE (UACMP) WITH MICROSCOPIC
Bilirubin Urine: NEGATIVE
Glucose, UA: NEGATIVE mg/dL
Hgb urine dipstick: NEGATIVE
Ketones, ur: NEGATIVE mg/dL
Leukocytes,Ua: NEGATIVE
Nitrite: NEGATIVE
Protein, ur: NEGATIVE mg/dL
Specific Gravity, Urine: 1.015 (ref 1.005–1.030)
pH: 7 (ref 5.0–8.0)

## 2022-02-08 LAB — BLADDER SCAN AMB NON-IMAGING

## 2022-02-08 MED ORDER — NITROFURANTOIN MONOHYD MACRO 100 MG PO CAPS: 100.0000 mg | ORAL_CAPSULE | Freq: Two times a day (BID) | ORAL | 0 refills | Status: DC

## 2022-02-08 NOTE — Patient Instructions (Signed)
Kegel Exercises ?Kegel exercises can help strengthen your pelvic floor muscles. The pelvic floor is a group of muscles that support your rectum, small intestine, and bladder. In females, pelvic floor muscles also help support the uterus. These muscles help you control the flow of urine and stool (feces). ?Kegel exercises are painless and simple. They do not require any equipment. Your provider may suggest Kegel exercises to: ?Improve bladder and bowel control. ?Improve sexual response. ?Improve weak pelvic floor muscles after surgery to remove the uterus (hysterectomy) or after pregnancy, in females. ?Improve weak pelvic floor muscles after prostate gland removal or surgery, in males. ?Kegel exercises involve squeezing your pelvic floor muscles. These are the same muscles you squeeze when you try to stop the flow of urine or keep from passing gas. The exercises can be done while sitting, standing, or lying down, but it is best to vary your position. ?Ask your health care provider which exercises are safe for you. Do exercises exactly as told by your health care provider and adjust them as directed. Do not begin these exercises until told by your health care provider. ?Exercises ?How to do Kegel exercises: ?Squeeze your pelvic floor muscles tight. You should feel a tight lift in your rectal area. If you are a female, you should also feel a tightness in your vaginal area. Keep your stomach, buttocks, and legs relaxed. ?Hold the muscles tight for up to 10 seconds. ?Breathe normally. ?Relax your muscles for up to 10 seconds. ?Repeat as told by your health care provider. ?Repeat this exercise daily as told by your health care provider. Continue to do this exercise for at least 4-6 weeks, or for as long as told by your health care provider. ?You may be referred to a physical therapist who can help you learn more about how to do Kegel exercises. ?Depending on your condition, your health care provider may  recommend: ?Varying how long you squeeze your muscles. ?Doing several sets of exercises every day. ?Doing exercises for several weeks. ?Making Kegel exercises a part of your regular exercise routine. ?This information is not intended to replace advice given to you by your health care provider. Make sure you discuss any questions you have with your health care provider. ?Document Revised: 04/26/2021 Document Reviewed: 04/26/2021 ?Elsevier Patient Education ? 2022 Elsevier Inc. ? ?

## 2022-07-23 ENCOUNTER — Other Ambulatory Visit: Payer: Self-pay | Admitting: Internal Medicine

## 2022-07-23 DIAGNOSIS — Z1231 Encounter for screening mammogram for malignant neoplasm of breast: Secondary | ICD-10-CM

## 2022-08-22 ENCOUNTER — Ambulatory Visit
Admission: RE | Admit: 2022-08-22 | Discharge: 2022-08-22 | Disposition: A | Discharge status: Home / Self Care | Payer: Medicare Other | Source: Ambulatory Visit | Attending: Internal Medicine | Admitting: Internal Medicine

## 2022-08-22 DIAGNOSIS — Z1231 Encounter for screening mammogram for malignant neoplasm of breast: Secondary | ICD-10-CM | POA: Insufficient documentation

## 2022-10-28 ENCOUNTER — Encounter (INDEPENDENT_AMBULATORY_CARE_PROVIDER_SITE_OTHER): Payer: Self-pay

## 2023-02-12 ENCOUNTER — Ambulatory Visit: Payer: Medicare Other | Admitting: Urology

## 2023-02-12 VITALS — BP 152/92 | HR 83 | Ht <= 58 in | Wt 128.0 lb

## 2023-02-12 DIAGNOSIS — N390 Urinary tract infection, site not specified: Secondary | ICD-10-CM

## 2023-02-12 DIAGNOSIS — N8110 Cystocele, unspecified: Secondary | ICD-10-CM

## 2023-02-12 DIAGNOSIS — Z87440 Personal history of urinary (tract) infections: Secondary | ICD-10-CM | POA: Diagnosis not present

## 2023-02-12 DIAGNOSIS — N8111 Cystocele, midline: Secondary | ICD-10-CM

## 2023-02-12 LAB — BLADDER SCAN AMB NON-IMAGING: Scan Result: 23

## 2023-02-12 NOTE — Progress Notes (Signed)
I, Shelley Hall,acting as a scribe for Shelley Espy, MD.,have documented all relevant documentation on the behalf of Shelley Espy, MD,as directed by  Shelley Espy, MD while in the presence of Shelley Espy, MD.   I, [name] as a scribe for Shelley Espy, MD.,have documented all relevant documentation on the behalf of Shelley Espy, MD,as directed by  Shelley Espy, MD while in the presence of Shelley Espy, MD.   02/12/23 3:18 PM   Shelley Hall 02/16/1946 [id number]  Referring provider: Kirk Ruths, MD [facility] [place],  [place] [address]  Chief Complaint  Patient presents with   Recurrent UTI    HPI: 77 year-old female with recurrent UTI's who presents today for a follow-up.   Over the past year, she has undergone four urinalysis at an outside facility, with results from July 19th, 2023, and August 2023 being highly suspicious for UTI, though no associated urine cultures were performed.The remaining urinalysis were either equivocal or fairly bland. Notably, all cultures have grown either E. coli or Citrobacter. These were all performed by her primary care.   During her last visit, a renal ultrasound was recommended to evaluate her upper tracts due to chronic bacteria, but it appears this was not completed.  She reports using a combination of cranberry and probiotics, which she believes has been helpful in managing her UTI's. She continues to use estrogen cream three times a week as part of her treatment regimen. She denies recent symptoms of UTI. Additionally, she mentions a prolapse bladder but expresses a desire to avoid surgery due to concerns about anesthesia, preferring conservative management. She reports issues related to prolapse bladder, including difficulty urinating which requires positional adjustments.  Results for orders placed or performed in visit on 02/12/23  Bladder Scan (Post Void Residual) in  office  Result Value Ref Range   Scan Result 23 ml      PMH: Past Medical History:  Diagnosis Date   Arthritis    Female bladder prolapse    GERD (gastroesophageal reflux disease)    Glaucoma    Hypertension    PONV (postoperative nausea and vomiting)    after hand surgery 2015   Recurrent UTI     Surgical History: Past Surgical History:  Procedure Laterality Date   BREAST CYST ASPIRATION Right 1989   CATARACT EXTRACTION W/PHACO Left 10/23/2021   Procedure: CATARACT EXTRACTION PHACO AND INTRAOCULAR LENS PLACEMENT ([place]) LEFT PANOPTIX LENS;  Surgeon: Birder Robson, MD;  Location: [place];  Service: Ophthalmology;  Laterality: Left;  4.98 00:34.9   CATARACT EXTRACTION W/PHACO Right 11/06/2021   Procedure: CATARACT EXTRACTION PHACO AND INTRAOCULAR LENS PLACEMENT ([place]) RIGHT PANOPTIX LENS;  Surgeon: Birder Robson, MD;  Location: [place];  Service: Ophthalmology;  Laterality: Right;  8.31 0:55.9   HAND SURGERY      Home Medications:  Allergies as of 02/12/2023       Reactions   Codeine Nausea Only   Sulfamethoxazole-trimethoprim Nausea Only   Doxycycline    Other reaction(s): Headache Headaches Pt has had same headaches off medication   Sulfa Antibiotics Nausea Only        Medication List        Accurate as of February 12, 2023  3:18 PM. If you have any questions, ask your nurse or doctor.          STOP taking these medications    AZO-CRANBERRY PO   D-MANNOSE PO  nitrofurantoin (macrocrystal-monohydrate) 100 MG capsule Commonly known as: Macrobid   saccharomyces boulardii 250 MG capsule Commonly known as: FLORASTOR   triamcinolone cream 0.1 % Commonly known as: KENALOG       TAKE these medications    aspirin EC 81 MG tablet Take by mouth.   BIOTIN PO Take by mouth daily.   brimonidine-timolol 0.2-0.5 % ophthalmic solution Commonly known as: COMBIGAN Place 1 drop into both eyes every 12 (twelve) hours.    CALTRATE 600+D PO Take by mouth.   [employer] Probiotic Maximum Strength Caps   ECHINACEA/GOLDEN SEAL PO Take by mouth daily. Echinacea/Golden seal   estradiol 0.1 MG/GM vaginal cream Commonly known as: ESTRACE Pleace pea size amount to urethra twice a week.   furosemide 20 MG tablet Commonly known as: LASIX Take by mouth.   LUBRICANT EYE DROPS OP Apply to eye.   Magnesium 300 MG Caps Take by mouth daily.   meloxicam 15 MG tablet Commonly known as: MOBIC Take 15 mg by mouth daily as needed.   OMEGA-3 KRILL OIL PO Take by mouth daily.   omeprazole 20 MG capsule Commonly known as: PRILOSEC   OVER THE COUNTER MEDICATION URIALIVE   potassium chloride 10 MEQ tablet Commonly known as: KLOR-CON M Take 1 tablet by mouth daily.   CENTRUM SILVER ULTRA WOMENS PO Take by mouth daily.   PreserVision AREDS 2 Caps   valsartan-hydrochlorothiazide 160-12.5 MG tablet Commonly known as: DIOVAN-HCT        Allergies:  Allergies  Allergen Reactions   Codeine Nausea Only   Sulfamethoxazole-Trimethoprim Nausea Only   Doxycycline     Other reaction(s): Headache Headaches Pt has had same headaches off medication    Sulfa Antibiotics Nausea Only    Family History: Family History  Problem Relation Age of Onset   Breast cancer Neg Hx     Social History:  reports that she has never smoked. She has never used smokeless tobacco. She reports that she does not currently use alcohol. She reports that she does not use drugs.   Physical Exam: BP (!) 152/92   Pulse 83   Ht 4' 10"$  (1.473 m)   Wt 128 lb (58.1 kg)   BMI 26.75 kg/m   Constitutional:  Alert and oriented, No acute distress. HEENT: [place] AT, moist mucus membranes.  Trachea midline, no masses. Neurologic: Grossly intact, no focal deficits, moving all 4 extremities. Psychiatric: Normal mood and affect.  Assessment & Plan:    Recurrent UTI - She reports subjective improvement with the use of cranberry and probiotics  supplements. It was advised to continue current management and only treat with antibiotics if symptomatic for UTI.  -Continue to educate the patient about the difference between chronic asymptomatic bacteriuria and true urinary tract infections, reviewed signs and symptoms of systemic infection and cystitis versus a chronic bacterial state - A renal ultrasound previously recommended was not completed, but given her stable condition, it will be deferred unless future complications arise.  2. Pelvic organ prolapse/cystocele - She is emptying well. - She expresses a desire to avoid surgical intervention for her prolapse bladder, opting for conservative management. She was advised on potential use of a pessary but has concerns about its maintenance and fit. The plan is to continue conservative management and follow up as needed.  Return if symptoms worsen or fail to improve.  I have reviewed the above documentation for accuracy and completeness, and I agree with the above.   Shelley Espy, MD   [facility] [address] [place], [place] [address] [phone]

## 2023-02-13 LAB — MICROSCOPIC EXAMINATION

## 2023-02-13 LAB — URINALYSIS, COMPLETE
Bilirubin, UA: NEGATIVE
Glucose, UA: NEGATIVE
Ketones, UA: NEGATIVE
Nitrite, UA: NEGATIVE
Protein,UA: NEGATIVE
RBC, UA: NEGATIVE
Specific Gravity, UA: 1.02 (ref 1.005–1.030)
Urobilinogen, Ur: 0.2 mg/dL (ref 0.2–1.0)
pH, UA: 7 (ref 5.0–7.5)

## 2023-08-05 ENCOUNTER — Other Ambulatory Visit: Payer: Self-pay | Admitting: Internal Medicine

## 2023-08-05 DIAGNOSIS — Z1231 Encounter for screening mammogram for malignant neoplasm of breast: Secondary | ICD-10-CM

## 2023-08-27 ENCOUNTER — Ambulatory Visit
Admission: RE | Admit: 2023-08-27 | Discharge: 2023-08-27 | Disposition: A | Discharge status: Home / Self Care | Payer: Medicare Other | Source: Ambulatory Visit | Attending: Internal Medicine | Admitting: Internal Medicine

## 2023-08-27 DIAGNOSIS — Z1231 Encounter for screening mammogram for malignant neoplasm of breast: Secondary | ICD-10-CM | POA: Diagnosis present

## 2024-07-26 ENCOUNTER — Other Ambulatory Visit: Payer: Self-pay | Admitting: Internal Medicine

## 2024-07-26 DIAGNOSIS — Z1231 Encounter for screening mammogram for malignant neoplasm of breast: Secondary | ICD-10-CM

## 2024-08-27 ENCOUNTER — Encounter

## 2024-09-03 ENCOUNTER — Ambulatory Visit
Admission: RE | Admit: 2024-09-03 | Discharge: 2024-09-03 | Disposition: A | Discharge status: Home / Self Care | Source: Ambulatory Visit | Attending: Internal Medicine | Admitting: Internal Medicine

## 2024-09-03 DIAGNOSIS — Z1231 Encounter for screening mammogram for malignant neoplasm of breast: Secondary | ICD-10-CM | POA: Diagnosis present

## 2025-02-15 ENCOUNTER — Encounter (INDEPENDENT_AMBULATORY_CARE_PROVIDER_SITE_OTHER): Admitting: Nurse Practitioner
